# Patient Record
Sex: Female | Born: 1965
Health system: Southern US, Community
[De-identification: ages and names within clinical notes are randomized; demographics above are authoritative.]

## PROBLEM LIST (undated history)

## (undated) DIAGNOSIS — K219 Gastro-esophageal reflux disease without esophagitis: Secondary | ICD-10-CM

## (undated) DIAGNOSIS — E119 Type 2 diabetes mellitus without complications: Secondary | ICD-10-CM

## (undated) DIAGNOSIS — M549 Dorsalgia, unspecified: Secondary | ICD-10-CM

## (undated) DIAGNOSIS — G8929 Other chronic pain: Secondary | ICD-10-CM

## (undated) DIAGNOSIS — M5430 Sciatica, unspecified side: Secondary | ICD-10-CM

## (undated) DIAGNOSIS — J45909 Unspecified asthma, uncomplicated: Secondary | ICD-10-CM

## (undated) HISTORY — DX: Type 2 diabetes mellitus without complications: E11.9

## (undated) HISTORY — PX: VEIN SURGERY: SHX48

---

## 2002-03-14 ENCOUNTER — Ambulatory Visit (HOSPITAL_COMMUNITY): Admission: RE | Admit: 2002-03-14 | Discharge: 2002-03-14 | Payer: Self-pay | Admitting: Internal Medicine

## 2002-03-14 ENCOUNTER — Encounter: Payer: Self-pay | Admitting: Internal Medicine

## 2006-09-24 ENCOUNTER — Ambulatory Visit (HOSPITAL_COMMUNITY): Admission: RE | Admit: 2006-09-24 | Discharge: 2006-09-24 | Payer: Self-pay | Admitting: Family Medicine

## 2007-07-06 ENCOUNTER — Ambulatory Visit: Payer: Self-pay | Admitting: Internal Medicine

## 2007-07-06 DIAGNOSIS — K219 Gastro-esophageal reflux disease without esophagitis: Secondary | ICD-10-CM | POA: Insufficient documentation

## 2007-07-06 DIAGNOSIS — I739 Peripheral vascular disease, unspecified: Secondary | ICD-10-CM | POA: Insufficient documentation

## 2007-07-06 DIAGNOSIS — J45909 Unspecified asthma, uncomplicated: Secondary | ICD-10-CM | POA: Insufficient documentation

## 2007-07-07 ENCOUNTER — Telehealth (INDEPENDENT_AMBULATORY_CARE_PROVIDER_SITE_OTHER): Payer: Self-pay | Admitting: *Deleted

## 2007-07-08 ENCOUNTER — Encounter (INDEPENDENT_AMBULATORY_CARE_PROVIDER_SITE_OTHER): Payer: Self-pay | Admitting: Internal Medicine

## 2007-07-11 ENCOUNTER — Ambulatory Visit (HOSPITAL_COMMUNITY): Admission: RE | Admit: 2007-07-11 | Discharge: 2007-07-11 | Payer: Self-pay | Admitting: Internal Medicine

## 2007-07-13 ENCOUNTER — Telehealth (INDEPENDENT_AMBULATORY_CARE_PROVIDER_SITE_OTHER): Payer: Self-pay | Admitting: Internal Medicine

## 2007-07-14 ENCOUNTER — Encounter (INDEPENDENT_AMBULATORY_CARE_PROVIDER_SITE_OTHER): Payer: Self-pay | Admitting: Internal Medicine

## 2007-07-16 ENCOUNTER — Encounter (INDEPENDENT_AMBULATORY_CARE_PROVIDER_SITE_OTHER): Payer: Self-pay | Admitting: Internal Medicine

## 2007-07-20 ENCOUNTER — Telehealth (INDEPENDENT_AMBULATORY_CARE_PROVIDER_SITE_OTHER): Payer: Self-pay | Admitting: *Deleted

## 2007-07-21 ENCOUNTER — Encounter (INDEPENDENT_AMBULATORY_CARE_PROVIDER_SITE_OTHER): Payer: Self-pay | Admitting: Internal Medicine

## 2007-07-21 ENCOUNTER — Telehealth (INDEPENDENT_AMBULATORY_CARE_PROVIDER_SITE_OTHER): Payer: Self-pay | Admitting: *Deleted

## 2007-07-22 ENCOUNTER — Encounter (INDEPENDENT_AMBULATORY_CARE_PROVIDER_SITE_OTHER): Payer: Self-pay | Admitting: Internal Medicine

## 2007-07-25 ENCOUNTER — Ambulatory Visit (HOSPITAL_COMMUNITY): Admission: RE | Admit: 2007-07-25 | Discharge: 2007-07-25 | Payer: Self-pay | Admitting: Internal Medicine

## 2007-08-29 ENCOUNTER — Ambulatory Visit: Payer: Self-pay | Admitting: Internal Medicine

## 2007-08-29 DIAGNOSIS — D485 Neoplasm of uncertain behavior of skin: Secondary | ICD-10-CM

## 2007-08-31 ENCOUNTER — Telehealth (INDEPENDENT_AMBULATORY_CARE_PROVIDER_SITE_OTHER): Payer: Self-pay | Admitting: Internal Medicine

## 2007-09-02 ENCOUNTER — Telehealth (INDEPENDENT_AMBULATORY_CARE_PROVIDER_SITE_OTHER): Payer: Self-pay | Admitting: *Deleted

## 2007-09-05 ENCOUNTER — Encounter (INDEPENDENT_AMBULATORY_CARE_PROVIDER_SITE_OTHER): Payer: Self-pay | Admitting: Internal Medicine

## 2007-10-12 ENCOUNTER — Telehealth (INDEPENDENT_AMBULATORY_CARE_PROVIDER_SITE_OTHER): Payer: Self-pay | Admitting: Internal Medicine

## 2007-10-12 ENCOUNTER — Telehealth (INDEPENDENT_AMBULATORY_CARE_PROVIDER_SITE_OTHER): Payer: Self-pay | Admitting: *Deleted

## 2007-10-12 DIAGNOSIS — I8390 Asymptomatic varicose veins of unspecified lower extremity: Secondary | ICD-10-CM | POA: Insufficient documentation

## 2007-10-17 ENCOUNTER — Ambulatory Visit: Payer: Self-pay | Admitting: Vascular Surgery

## 2007-11-15 ENCOUNTER — Ambulatory Visit: Payer: Self-pay | Admitting: Vascular Surgery

## 2007-12-26 ENCOUNTER — Ambulatory Visit: Payer: Self-pay | Admitting: Vascular Surgery

## 2008-01-03 ENCOUNTER — Ambulatory Visit: Payer: Self-pay | Admitting: Vascular Surgery

## 2009-01-28 ENCOUNTER — Other Ambulatory Visit: Admission: RE | Admit: 2009-01-28 | Discharge: 2009-01-28 | Payer: Self-pay | Admitting: Obstetrics and Gynecology

## 2009-03-27 ENCOUNTER — Ambulatory Visit: Payer: Self-pay | Admitting: Internal Medicine

## 2009-03-27 DIAGNOSIS — J069 Acute upper respiratory infection, unspecified: Secondary | ICD-10-CM | POA: Insufficient documentation

## 2011-03-10 NOTE — Consult Note (Signed)
VASCULAR SURGERY CONSULTATION   [name], Jessica F  DOB:  October 10, 1966                                       10/17/2007  ZOXWR#:[id number]   This is a vascular surgery consultation.   Jessica [name] is a 45 year old female patient who developed a stasis ulcer  in the right ankle area about 3-4 months ago.  She has treated this with  antibiotic ointment and has tried wearing a short-leg elastic  compression stocking at home, but has had no improvement, and she was  referred for further evaluation.  She has had varicose veins in the  right calf area for many years, and has noticed some darkening of the  skin over the last few years in the right ankle area.  She describes the  pain over these areas as burning, throbbing and aching in the calf, and  also in the area of the ulceration, does have swelling as the day  progresses.  She has no history of deep venous thrombosis,  thrombophlebitis, pulmonary emboli or clotting problems.  She has had no  bleeding from the ulcer.   PAST MEDICAL HISTORY:  Negative for diabetes, hypertension, coronary  artery disease, hyperlipidemia or stroke.   PAST SURGICAL HISTORY:  None.   FAMILY HISTORY:  Negative for coronary artery disease, diabetes and  stroke.   SOCIAL HISTORY:  She is married, has 2 children.  Smokes a half-pack of  cigarettes per day, and does drink occasional alcohol.   ALLERGIES:  None known.   MEDICATIONS:  None.   PHYSICAL EXAMINATION:  Blood pressure 135/77, heart rate 68,  respirations 16.  GENERAL:  She is an obese, middle-aged female, no apparent distress.  Alert and oriented x3.  NECK:  Supple.  3+ carotid pulse is palpable.  No bruits are audible.  CHEST:  Clear to auscultation.  CARDIOVASCULAR EXAM:  Regular rhythm with no murmurs.  ABDOMEN:  Obese.  No palpable masses.  She has 3+ femoral, popliteal and dorsalis pedal pulses palpable  bilaterally.  Right leg has typical varicosities in the right  greater  saphenous distribution in the proximal calf and distal thigh area.  There is hyperpigmentation in the lower third of the right leg with an  active venous stasis ulcer measuring a centimeter in diameter proximal  to the medial malleolus.  There is 1+ edema distally.  Left leg has no  appreciable varicosities and is well perfused.   Venous duplex exam reveals gross incompetence of the right  saphenofemoral junction and right greater saphenous vein with a  competent right short saphenous vein.   She does have severe venous insufficiency with a CEAP-6 venous stasis  ulcer, active in the right lower third of the leg.  She will be fitted  for long-leg elastic compression stockings, and continued conservative  treatment for this ulceration, and I will have her return in 4 weeks for  further examination.  She currently does not have any health insurance,  and is going to look into obtaining Medicaid for this procedure, and  will look into that before returning at our next visit.  She will  require laser ablation of her right greater saphenous system in order to  heal this ulcer on a chronic basis.   Quita Skye Hart [place], M.D.  Electronically Signed  JDL/MEDQ  D:  10/17/2007  T:  10/18/2007  Job:  [id number] 

## 2011-03-10 NOTE — Assessment & Plan Note (Signed)
OFFICE VISIT   Jessica [name], Jessica [name]  DOB:  Oct 17, 1966                                       11/15/2007  ALPFX#:[id number]   The patient returns today and her venous stasis ulcer has significantly  improved since wearing elastic compression stockings.  Today, measures  about 0.7 to 0.8 cm with eschar being less inflamed.  There is some  chronic hyperpigmentation in the skin surrounding this.  She has been  wearing her elastic stocking daily.  Her husband has gotten employment  and will be available for health insurance in 3 months, and she will  return in 3 months to discuss this, as long as this ulcer is improving  symptomatically.  If it starts progressing and becoming worse, she will  be in touch with [place], and we will see her again at that time.   Quita Skye Hart [place], M.D.  Electronically Signed   JDL/MEDQ  D:  11/15/2007  T:  11/16/2007  Job:  [id number]

## 2011-03-10 NOTE — Assessment & Plan Note (Signed)
OFFICE VISIT   [name], Jessica F  DOB:  03-25-1966                                       01/03/2008  ZOXWR#:[id number]   Patient underwent laser ablation of her right greater saphenous vein  with a few stab phlebectomies in the mid calf one week ago for a chronic  venous stasis ulcer, which has been healing with elastic compression.   Her leg looks good today with mild tenderness along the course of the  greater saphenous vein in the thigh.  There is no evidence of any  bruising or skin blistering.  There is no distal edema noted.  The  stasis ulcer continues to heal, being only a few millimeters in size  today and is quite clean and noninflamed.  The stab phlebectomy sites  have healed well.   I performed a venous duplex exam, and there is no evidence of any deep  venous thrombosis or clot, and the greater saphenous vein is totally  occluded from the area of the saphenofemoral junction to the knee.  She  is reassured regarding these findings, advised to wear chronic elastic  compression because of her history of stasis ulcers and return to see [place]  on a p.r.n. basis.   Quita Skye Hart [place], M.D.  Electronically Signed   JDL/MEDQ  D:  01/03/2008  T:  01/04/2008  Job:  [id number]

## 2011-03-10 NOTE — Procedures (Signed)
LOWER EXTREMITY VENOUS REFLUX EXAM   INDICATION:  Right lower extremity venostasis ulcer since this last  summer.  Also complains of pain and swelling to the right lower  extremity.  Denies history of deep or superficial venous thrombosis.   EXAM:  Using color-flow imaging and pulse Doppler spectral analysis, the  right common femoral, superficial femoral, popliteal, posterior tibial,  greater and lesser saphenous veins are evaluated.  There is no evidence  suggesting deep venous insufficiency in the right lower extremity  involving CSV.   The right saphenofemoral junction is not competent.  The right GSV is  not competent with the caliber as described below.   The right proximal short saphenous vein demonstrates competency for GSV  diameters.   GSV Diameter (used if found to be incompetent only)                                            Right        Left  Proximal Greater Saphenous Vein           1.5 cm       cm  Proximal-to-mid-thigh                     0.97 cm      cm  Mid thigh                                 0.83 cm      cm  Mid-distal thigh                          0.95 cm      cm  Distal thigh                              0.92 cm      cm                                 Knee  0.73   Perforators noted in the right calf.  May warrant further interrogation  in the future if clinically indicated and the wound is healed. cm cm   IMPRESSION:  1. Right greater saphenous vein reflux is identified with the caliber      ranging from 0.73 cm to 1.5 cm knee to groin.  2. 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  3. The right greater saphenous vein is not tortuous.  4. The deep venous system is not competent in the right common femoral      vein.  5. The right lesser saphenous vein is competent.   ___________________________________________  Quita Skye. Hart [place], M.D.   AR/MEDQ  D:  10/18/2007  T:  10/19/2007  Job:   [id number]

## 2013-05-07 ENCOUNTER — Emergency Department (HOSPITAL_COMMUNITY)
Admission: EM | Admit: 2013-05-07 | Discharge: 2013-05-07 | Disposition: A | Discharge status: Home / Self Care | Payer: BC Managed Care – PPO | Attending: Emergency Medicine | Admitting: Emergency Medicine

## 2013-05-07 ENCOUNTER — Encounter (HOSPITAL_COMMUNITY): Payer: Self-pay | Admitting: *Deleted

## 2013-05-07 DIAGNOSIS — R5383 Other fatigue: Secondary | ICD-10-CM | POA: Insufficient documentation

## 2013-05-07 DIAGNOSIS — F17200 Nicotine dependence, unspecified, uncomplicated: Secondary | ICD-10-CM | POA: Insufficient documentation

## 2013-05-07 DIAGNOSIS — R5381 Other malaise: Secondary | ICD-10-CM | POA: Insufficient documentation

## 2013-05-07 DIAGNOSIS — Z3202 Encounter for pregnancy test, result negative: Secondary | ICD-10-CM | POA: Insufficient documentation

## 2013-05-07 DIAGNOSIS — R55 Syncope and collapse: Secondary | ICD-10-CM

## 2013-05-07 LAB — URINALYSIS, ROUTINE W REFLEX MICROSCOPIC
Bilirubin Urine: NEGATIVE
Glucose, UA: NEGATIVE mg/dL
Hgb urine dipstick: NEGATIVE
Ketones, ur: NEGATIVE mg/dL
Leukocytes, UA: NEGATIVE
Protein, ur: NEGATIVE mg/dL
pH: 6.5 (ref 5.0–8.0)

## 2013-05-07 LAB — CBC WITH DIFFERENTIAL/PLATELET
Basophils Relative: 0 % (ref 0–1)
Eosinophils Absolute: 0.2 10*3/uL (ref 0.0–0.7)
Eosinophils Relative: 2 % (ref 0–5)
Hemoglobin: 15.6 g/dL — ABNORMAL HIGH (ref 12.0–15.0)
Lymphs Abs: 1.8 10*3/uL (ref 0.7–4.0)
MCH: 31.1 pg (ref 26.0–34.0)
MCHC: 34.2 g/dL (ref 30.0–36.0)
MCV: 91 fL (ref 78.0–100.0)
Monocytes Relative: 8 % (ref 3–12)
Neutrophils Relative %: 76 % (ref 43–77)

## 2013-05-07 LAB — BASIC METABOLIC PANEL
BUN: 8 mg/dL (ref 6–23)
CO2: 28 mEq/L (ref 19–32)
Calcium: 9.1 mg/dL (ref 8.4–10.5)
GFR calc non Af Amer: >90 mL/min (ref >90)
Glucose, Bld: 141 mg/dL — ABNORMAL HIGH (ref 70–99)

## 2013-05-07 NOTE — ED Notes (Addendum)
Pt c/o sudden onset of dizziness associated with nausea. Pt states that she is "ok" just sitting in chair but when she tries to get up or turns her head she becomes "swimmy headed". Denies any pain, LOC, weakness  Speech clear, face is symmetrical. Pt did take her blood pressure at home with reading of 174/96

## 2013-05-07 NOTE — ED Notes (Signed)
Pt presents with dizziness, lightheaded, nauseated and weakness this morning. Pt states was sitting at computer when episode occurred. Assisted to sofa where pt states felt better and got in shower.  Pt states feels better at this time. Pt denies diabetes, fever,and SOB/ Chest pain. NAD noted at this time. No deficits noted at this time.

## 2013-05-07 NOTE — ED Provider Notes (Signed)
History    CSN: [id number] Arrival date & time 05/07/13  1251  First MD Initiated Contact with Patient 05/07/13 1313     Chief Complaint  Patient presents with  . Dizziness    Patient is a 47 y.o. female presenting with weakness. The history is provided by the patient.  Weakness This is a new problem. The current episode started 1 to 2 hours ago. The problem occurs constantly. The problem has been gradually improving. Pertinent negatives include no chest pain, no abdominal pain, no headaches and no shortness of breath. Nothing aggravates the symptoms. Nothing relieves the symptoms. She has tried rest for the symptoms. The treatment provided moderate relief.  pt reports she was looking at computer earlier today when she felt as though she might pass out.  She felt lightheaded and diffuse weakness No HA No visual changes No CP/SOB No focal weakness No syncope Denies vertigo She has never had this before She can ambulate No meds taken today She has no medical problems No h/o syncope previously  No h/o CAD/CVA  PMH - none  Past Surgical History  Procedure Laterality Date  . Cesarean section    . Vein surgery     No family history on file. History  Substance Use Topics  . Smoking status: Current Every Day Smoker  . Smokeless tobacco: Not on file  . Alcohol Use: Yes     Comment: occassional   OB History   Grav Para Term Preterm Abortions TAB SAB Ect Mult Living                 Review of Systems  Constitutional: Negative for fever.  Eyes: Negative for visual disturbance.  Respiratory: Negative for shortness of breath.   Cardiovascular: Negative for chest pain.  Gastrointestinal: Negative for vomiting, abdominal pain, diarrhea and blood in stool.  Genitourinary: Negative for dysuria and vaginal bleeding.  Musculoskeletal: Negative for back pain.  Neurological: Positive for dizziness and weakness. Negative for syncope, speech difficulty and headaches.   Psychiatric/Behavioral: Negative for agitation.  All other systems reviewed and are negative.    Allergies  Hydrocodone  Home Medications  No current outpatient prescriptions on file. BP 174/100  Pulse 78  Temp(Src) 98.5 F (36.9 C) (Oral)  Resp 18  Ht 5\' 6"  (1.676 m)  Wt 283 lb (128.368 kg)  BMI 45.7 kg/m2  SpO2 96%  LMP 04/07/2013 Physical Exam CONSTITUTIONAL: Well developed/well nourished HEAD: Normocephalic/atraumatic EYES: EOMI/PERRL, no nystagmus ENMT: Mucous membranes moist, uvula midline, pharynx nonerythematous NECK: supple no meningeal signs, no bruits SPINE:entire spine nontender CV: S1/S2 noted, no murmurs/rubs/gallops noted LUNGS: Lungs are clear to auscultation bilaterally, no apparent distress ABDOMEN: soft, nontender, no rebound or guarding GU:no cva tenderness NEURO:Awake/alert, facies symmetric, no arm or leg drift is noted Cranial nerves 3/4/5/6/05/03/09/11/12 tested and intact Gait normal without ataxia No past pointing EXTREMITIES: pulses normal, full ROM SKIN: warm, color normal PSYCH: no abnormalities of mood noted   ED Course  Procedures Labs Reviewed  BASIC METABOLIC PANEL  CBC WITH DIFFERENTIAL  TROPONIN I  URINALYSIS, ROUTINE W REFLEX MICROSCOPIC   Pt stable in the ED She had elevation in HR after standing but no hypotension She is in no distress, resting comfortably On reassessment, suspicion for ACS is low.  She had no cp/sob.   She denied any symptoms upon movement of her head here in the ED   I doubt significant cardiac dysrhythmia/ACS/PE No signs of acute CVA or occult posterior stroke No recent  trauma to suggest vascular injury to neck I advised her to quit smoking  We discussed strict return precautions She was referred to cardiology and may need to wear holter monitor  MDM  Nursing notes including past medical history and social history reviewed and considered in documentation Labs/vital reviewed and considered     Date: 05/07/2013 1404  Rate: 65  Rhythm: normal sinus rhythm  QRS Axis: normal  Intervals: normal  ST/T Wave abnormalities: normal  Conduction Disutrbances:none      Joya Gaskins, MD 05/07/13 1537

## 2013-10-28 ENCOUNTER — Encounter (HOSPITAL_COMMUNITY): Payer: Self-pay | Admitting: Emergency Medicine

## 2013-10-28 ENCOUNTER — Emergency Department (HOSPITAL_COMMUNITY)
Admission: EM | Admit: 2013-10-28 | Discharge: 2013-10-28 | Disposition: A | Discharge status: Home / Self Care | Payer: BC Managed Care – PPO | Attending: Emergency Medicine | Admitting: Emergency Medicine

## 2013-10-28 DIAGNOSIS — F17200 Nicotine dependence, unspecified, uncomplicated: Secondary | ICD-10-CM | POA: Insufficient documentation

## 2013-10-28 DIAGNOSIS — M5431 Sciatica, right side: Secondary | ICD-10-CM

## 2013-10-28 DIAGNOSIS — R05 Cough: Secondary | ICD-10-CM | POA: Insufficient documentation

## 2013-10-28 DIAGNOSIS — J3489 Other specified disorders of nose and nasal sinuses: Secondary | ICD-10-CM | POA: Insufficient documentation

## 2013-10-28 DIAGNOSIS — M5430 Sciatica, unspecified side: Secondary | ICD-10-CM | POA: Insufficient documentation

## 2013-10-28 DIAGNOSIS — R059 Cough, unspecified: Secondary | ICD-10-CM | POA: Insufficient documentation

## 2013-10-28 NOTE — ED Notes (Signed)
Pt able to ambulate to bathroom without difficulty. Gait WNL.

## 2013-10-28 NOTE — Discharge Instructions (Signed)
Your examination and history are consistent with possible sciatica. Please alternate heat and ice to your lower back. Please rest your back with legs elevated when possible. Please use Aleve daily. Please use Tylenol in between the doses if needed for pain. Please see your primary physician as sone as possible for additional workup and possible referral to specialist. Sciatica Sciatica is pain, weakness, numbness, or tingling along your sciatic nerve. The nerve starts in the lower back and runs down the back of each leg. Nerve damage or certain conditions pinch or put pressure on the sciatic nerve. This causes the pain, weakness, and other discomforts of sciatica. HOME CARE   Only take medicine as told by your doctor.  Apply ice to the affected area for 20 minutes. Do this 3 4 times a day for the first 48 72 hours. Then try heat in the same way.  Exercise, stretch, or do your usual activities if these do not make your pain worse.  Go to physical therapy as told by your doctor.  Keep all doctor visits as told.  Do not wear high heels or shoes that are not supportive.  Get a firm mattress if your mattress is too soft to lessen pain and discomfort. GET HELP RIGHT AWAY IF:   You cannot control when you poop (bowel movement) or pee (urinate).  You have more weakness in your lower back, lower belly (pelvis), butt (buttocks), or legs.  You have redness or puffiness (swelling) of your back.  You have a burning feeling when you pee.  You have pain that gets worse when you lie down.  You have pain that wakes you from your sleep.  Your pain is worse than past pain.  Your pain lasts longer than 4 weeks.  You are suddenly losing weight without reason. MAKE SURE YOU:   Understand these instructions.  Will watch this condition.  Will get help right away if you are not doing well or get worse. Document Released: 07/21/2008 Document Revised: 04/12/2012 Document Reviewed:  02/21/2012 [facility] Patient Information 2014 [place].

## 2013-10-28 NOTE — ED Provider Notes (Signed)
CSN: [id number]     Arrival date & time 10/28/13  1650 History   First MD Initiated Contact with Patient 10/28/13 1746     Chief Complaint  Patient presents with  . Numbness   (Consider location/radiation/quality/duration/timing/severity/associated sxs/prior Treatment) HPI Comments: Patient is a 48 year old female who presents to the emergency department with pain and at times numbness to the right lower back extending to the right leg. The patient denies any recent falls. She's not had any loss of bowel or bladder function. The patient states she's been having problems with her back for" months". She started having problem on last evening with pain and numbness going from the back down into the lower leg. She states she's been under a great deal of stress because of adult children. She was concerned she may be having a stroke, and presented to the emergency department for additional evaluation and management. There's been no changes in her speech, no changes in her thought pattern per the family member, and no new changes in her ability to walk.  The history is provided by the patient.    History reviewed. No pertinent past medical history. Past Surgical History  Procedure Laterality Date  . Cesarean section    . Vein surgery     No family history on file. History  Substance Use Topics  . Smoking status: Current Every Day Smoker  . Smokeless tobacco: Not on file  . Alcohol Use: Yes     Comment: occassional   OB History   Grav Para Term Preterm Abortions TAB SAB Ect Mult Living                 Review of Systems  Constitutional: Negative for activity change.       All ROS Neg except as noted in HPI  HENT: Negative for nosebleeds.   Eyes: Negative for photophobia and discharge.  Respiratory: Positive for cough. Negative for shortness of breath and wheezing.   Cardiovascular: Negative for chest pain and palpitations.  Gastrointestinal: Negative for abdominal pain and blood in  stool.  Genitourinary: Negative for dysuria, frequency and hematuria.  Musculoskeletal: Positive for back pain. Negative for arthralgias and neck pain.  Skin: Negative.   Neurological: Negative for dizziness, seizures and speech difficulty.  Psychiatric/Behavioral: Negative for hallucinations and confusion.    Allergies  Hydrocodone  Home Medications   Current Outpatient Rx  Name  Route  Sig  Dispense  Refill  . aspirin 325 MG tablet   Oral   Take 325 mg by mouth daily as needed for pain.          BP 154/73  Pulse 92  Temp(Src) 98.8 F (37.1 C) (Oral)  Resp 20  Ht 5\' 6"  (1.676 m)  Wt 280 lb (127.007 kg)  BMI 45.21 kg/m2  SpO2 98%  LMP 10/18/2013 Physical Exam  Nursing note and vitals reviewed. Constitutional: She is oriented to person, place, and time. She appears well-developed and well-nourished.  Non-toxic appearance.  HENT:  Head: Normocephalic.  Right Ear: Tympanic membrane and external ear normal.  Left Ear: Tympanic membrane and external ear normal.  Nasal congestion present.  Eyes: EOM and lids are normal. Pupils are equal, round, and reactive to light.  Neck: Normal range of motion. Neck supple. Carotid bruit is not present.  Cardiovascular: Normal rate, regular rhythm, normal heart sounds, intact distal pulses and normal pulses.   Pulmonary/Chest: Breath sounds normal. No respiratory distress.  Few scattered rhonchi present. Most clear with cough.  Abdominal: Soft. Bowel sounds are normal. There is no tenderness. There is no guarding.  Musculoskeletal: Normal range of motion.  Right paraspinal area tenderness. Pain with leg raise at 40. No hot areas appreciated.  Lymphadenopathy:       Head (right side): No submandibular adenopathy present.       Head (left side): No submandibular adenopathy present.    She has no cervical adenopathy.  Neurological: She is alert and oriented to person, place, and time. She has normal strength. No cranial nerve deficit or  sensory deficit. She exhibits normal muscle tone. Coordination normal.  No foot drop, no gross neurologic deficits appreciated.  Skin: Skin is warm and dry.  Psychiatric: She has a normal mood and affect. Her speech is normal.    ED Course  Procedures (including critical care time) Labs Review Labs Reviewed - No data to display Imaging Review No results found.  EKG Interpretation   None       MDM   1. Sciatica of right side    **I have reviewed nursing notes, vital signs, and all appropriate lab and imaging results for this patient.  Patient presents to the emergency department for evaluation of pain, and numbness from the right lower back down the right leg. There was no loss of bowel or bladder function. No recent falls. No foot drop or dragging of an extremity. The patient states she's been having back problems for" months", but this problem began on last evening and continued into the the day today.   No acute neurologic deficits appreciated. The vital signs are within normal limits. The blood pressure is slightly elevated at 154/73. Pulse oximetry is 98% on room.  I offered the patient muscle relaxers, nonsteroidal anti-inflammatory medication, and steroids. The patient declined these medications, states she will use Aleve and maybe Tylenol. The patient also states that she may try alternating heat and ice. Patient advised to see her primary physician for additional management including orthopedic referral and or MRI testing.  Lenox Ahr, PA-C 10/28/13 1849

## 2013-10-28 NOTE — ED Notes (Signed)
Pt states pain is with movement

## 2013-10-28 NOTE — ED Notes (Signed)
Complain of pain in right lower back and pain in right lower leg

## 2013-10-29 NOTE — ED Provider Notes (Signed)
Medical screening examination/treatment/procedure(s) were performed by non-physician practitioner and as supervising physician I was immediately available for consultation/collaboration.  EKG Interpretation   None       Rolland Porter, MD, Abram Sander   Janice Norrie, MD 10/29/13 Lupita Shutter

## 2013-11-01 ENCOUNTER — Encounter (HOSPITAL_COMMUNITY): Payer: Self-pay | Admitting: Emergency Medicine

## 2013-11-01 ENCOUNTER — Emergency Department (HOSPITAL_COMMUNITY)
Admission: EM | Admit: 2013-11-01 | Discharge: 2013-11-01 | Disposition: A | Discharge status: Home / Self Care | Payer: BC Managed Care – PPO | Attending: Emergency Medicine | Admitting: Emergency Medicine

## 2013-11-01 DIAGNOSIS — F17200 Nicotine dependence, unspecified, uncomplicated: Secondary | ICD-10-CM | POA: Insufficient documentation

## 2013-11-01 DIAGNOSIS — J329 Chronic sinusitis, unspecified: Secondary | ICD-10-CM | POA: Insufficient documentation

## 2013-11-01 DIAGNOSIS — M5412 Radiculopathy, cervical region: Secondary | ICD-10-CM | POA: Insufficient documentation

## 2013-11-01 HISTORY — DX: Sciatica, unspecified side: M54.30

## 2013-11-01 LAB — COMPREHENSIVE METABOLIC PANEL
ALT: 16 U/L (ref 0–35)
AST: 19 U/L (ref 0–37)
Albumin: 3.6 g/dL (ref 3.5–5.2)
Alkaline Phosphatase: 80 U/L (ref 39–117)
BILIRUBIN TOTAL: 0.4 mg/dL (ref 0.3–1.2)
BUN: 12 mg/dL (ref 6–23)
CALCIUM: 9.2 mg/dL (ref 8.4–10.5)
CHLORIDE: 101 meq/L (ref 96–112)
CO2: 29 meq/L (ref 19–32)
Creatinine, Ser: 0.59 mg/dL (ref 0.50–1.10)
GFR calc Af Amer: >90 mL/min (ref >90)
Glucose, Bld: 95 mg/dL (ref 70–99)
Potassium: 4.6 mEq/L (ref 3.7–5.3)
SODIUM: 139 meq/L (ref 137–147)
Total Protein: 8 g/dL (ref 6.0–8.3)

## 2013-11-01 LAB — CBC WITH DIFFERENTIAL/PLATELET
BASOS ABS: 0 10*3/uL (ref 0.0–0.1)
Basophils Relative: 0 % (ref 0–1)
Eosinophils Absolute: 0.1 10*3/uL (ref 0.0–0.7)
Eosinophils Relative: 1 % (ref 0–5)
HCT: 49.3 % — ABNORMAL HIGH (ref 36.0–46.0)
Hemoglobin: 16.6 g/dL — ABNORMAL HIGH (ref 12.0–15.0)
LYMPHS PCT: 16 % (ref 12–46)
Lymphs Abs: 1.8 10*3/uL (ref 0.7–4.0)
MCH: 31 pg (ref 26.0–34.0)
MCHC: 33.7 g/dL (ref 30.0–36.0)
MCV: 92 fL (ref 78.0–100.0)
Monocytes Absolute: 1 10*3/uL (ref 0.1–1.0)
Monocytes Relative: 9 % (ref 3–12)
NEUTROS ABS: 8.4 10*3/uL — AB (ref 1.7–7.7)
Neutrophils Relative %: 74 % (ref 43–77)
PLATELETS: 310 10*3/uL (ref 150–400)
RBC: 5.36 MIL/uL — ABNORMAL HIGH (ref 3.87–5.11)
RDW: 13.6 % (ref 11.5–15.5)
WBC: 11.3 10*3/uL — AB (ref 4.0–10.5)

## 2013-11-01 MED ORDER — AMOXICILLIN 500 MG PO CAPS: 500.0000 mg | ORAL_CAPSULE | Freq: Three times a day (TID) | ORAL | Status: DC

## 2013-11-01 NOTE — ED Notes (Signed)
Pt became dizzy when standing for ekg.  Vs obtained, 158/81, pulse 61

## 2013-11-01 NOTE — Discharge Instructions (Signed)
Follow up if not improving

## 2013-11-01 NOTE — ED Notes (Signed)
Pt alert & oriented x4, stable gait. Patient given discharge instructions, paperwork & prescription(s). Patient  instructed to stop at the registration desk to finish any additional paperwork. Patient verbalized understanding. Pt left department w/ no further questions. 

## 2013-11-01 NOTE — ED Notes (Signed)
Pt reports waking this am with dizziness, that has now resolved.

## 2013-11-01 NOTE — ED Provider Notes (Signed)
CSN: [id number]     Arrival date & time 11/01/13  0802 History   First MD Initiated Contact with Patient 11/01/13 0949 This chart was scribed for Maudry Diego, MD by Anastasia Pall, ED Scribe. This patient was seen in room APA03/APA03 and the patient's care was started at 9:59 AM.     Chief Complaint  Patient presents with  . Dizziness    Patient is a 48 y.o. female presenting with dizziness. The history is provided by the patient. No language interpreter was used.  Dizziness Quality:  Lightheadedness Duration: Earlier this morning after waking up. Timing: one episode. Progression:  Resolved Chronicity:  New Associated symptoms: no syncope   Associated symptoms comment:  Positive for cough, congestion.   HPI Comments: Jessica [name] is a 48 y.o. female who presents to the Emergency Department complaining of one episode of light-headedness, stating it feels like she is going to pass out, onset earlier this morning after waking up. She reports the incident has resolved. She reports h/o similar episode of light-headedness. She reports having cold symptoms for 2 weeks, but denies being on any medication for sinus infection. She reports being here recently and diagnosed with sciatica. She reports lower back pain and right leg numbness. She denies LOC, abdominal pain, any other associated symptoms.   PCP - No PCP Per Patient  Past Medical History  Diagnosis Date  . Sciatica    Past Surgical History  Procedure Laterality Date  . Cesarean section    . Vein surgery     No family history on file. History  Substance Use Topics  . Smoking status: Current Every Day Smoker    Types: Cigarettes  . Smokeless tobacco: Not on file  . Alcohol Use: Yes     Comment: occassional   OB History   Grav Para Term Preterm Abortions TAB SAB Ect Mult Living                 Review of Systems  Constitutional: Negative for appetite change and fatigue.  HENT: Positive for congestion. Negative for ear  discharge and sinus pressure.   Eyes: Negative for discharge.  Respiratory: Positive for cough.   Cardiovascular: Negative for syncope.  Gastrointestinal: Negative for abdominal pain.  Genitourinary: Negative for frequency and hematuria.  Musculoskeletal: Positive for back pain (lower).  Skin: Negative for rash.  Neurological: Positive for light-headedness and numbness (right leg). Negative for seizures and syncope.  Psychiatric/Behavioral: Negative for hallucinations.    Allergies  Hydrocodone  Home Medications   Current Outpatient Rx  Name  Route  Sig  Dispense  Refill  . naproxen sodium (ALEVE) 220 MG tablet   Oral   Take 220 mg by mouth 2 (two) times daily as needed.         Marland Kitchen aspirin 325 MG tablet   Oral   Take 325 mg by mouth daily as needed for pain.          BP 158/81  Pulse 61  Temp(Src) 98.3 F (36.8 C) (Oral)  Resp 20  Ht 5\' 6"  (1.676 m)  Wt 280 lb (127.007 kg)  BMI 45.21 kg/m2  SpO2 99%  LMP 10/18/2013  Physical Exam  Constitutional: She is oriented to person, place, and time. She appears well-developed.  HENT:  Head: Normocephalic.  Eyes: Conjunctivae and EOM are normal. No scleral icterus.  Neck: Neck supple. No thyromegaly present.  Cardiovascular: Normal rate and regular rhythm.  Exam reveals no gallop and no friction rub.  No murmur heard. Pulmonary/Chest: No stridor. She has no wheezes. She has no rales. She exhibits no tenderness.  Abdominal: She exhibits no distension. There is no tenderness. There is no rebound.  Musculoskeletal: Normal range of motion. She exhibits no edema.  Lymphadenopathy:    She has no cervical adenopathy.  Neurological: She is oriented to person, place, and time. She exhibits normal muscle tone. Coordination normal.  Skin: No rash noted. No erythema.  Psychiatric: She has a normal mood and affect. Her behavior is normal.    ED Course  Procedures (including critical care time)  DIAGNOSTIC STUDIES: Oxygen  Saturation is 99% on room air, normal by my interpretation.    COORDINATION OF CARE: 10:03 AM-Discussed treatment plan which includes CBC, CMP, EKG, Orthostatic vital with pt at bedside and pt agreed to plan.    Labs Review Labs Reviewed  CBC WITH DIFFERENTIAL - Abnormal; Notable for the following:    WBC 11.3 (*)    RBC 5.36 (*)    Hemoglobin 16.6 (*)    HCT 49.3 (*)    Neutro Abs 8.4 (*)    All other components within normal limits  COMPREHENSIVE METABOLIC PANEL   Imaging Review No results found.  EKG Interpretation   None       MDM  sinusitis  Maudry Diego, MD 11/01/13 1343

## 2013-11-07 ENCOUNTER — Emergency Department (HOSPITAL_COMMUNITY)
Admission: EM | Admit: 2013-11-07 | Discharge: 2013-11-08 | Disposition: A | Discharge status: Home / Self Care | Payer: BC Managed Care – PPO | Attending: Emergency Medicine | Admitting: Emergency Medicine

## 2013-11-07 ENCOUNTER — Encounter (HOSPITAL_COMMUNITY): Payer: Self-pay | Admitting: Emergency Medicine

## 2013-11-07 DIAGNOSIS — F17200 Nicotine dependence, unspecified, uncomplicated: Secondary | ICD-10-CM | POA: Insufficient documentation

## 2013-11-07 DIAGNOSIS — Z792 Long term (current) use of antibiotics: Secondary | ICD-10-CM | POA: Insufficient documentation

## 2013-11-07 DIAGNOSIS — M545 Low back pain, unspecified: Secondary | ICD-10-CM

## 2013-11-07 DIAGNOSIS — Z7982 Long term (current) use of aspirin: Secondary | ICD-10-CM | POA: Insufficient documentation

## 2013-11-07 DIAGNOSIS — J45909 Unspecified asthma, uncomplicated: Secondary | ICD-10-CM | POA: Insufficient documentation

## 2013-11-07 DIAGNOSIS — G8929 Other chronic pain: Secondary | ICD-10-CM | POA: Insufficient documentation

## 2013-11-07 DIAGNOSIS — K219 Gastro-esophageal reflux disease without esophagitis: Secondary | ICD-10-CM | POA: Insufficient documentation

## 2013-11-07 HISTORY — DX: Gastro-esophageal reflux disease without esophagitis: K21.9

## 2013-11-07 HISTORY — DX: Dorsalgia, unspecified: M54.9

## 2013-11-07 HISTORY — DX: Other chronic pain: G89.29

## 2013-11-07 HISTORY — DX: Unspecified asthma, uncomplicated: J45.909

## 2013-11-07 MED ORDER — HYDROMORPHONE HCL PF 1 MG/ML IJ SOLN
1.0000 mg | Freq: Once | INTRAMUSCULAR | Status: AC
  Administered 2013-11-07: 1 mg via INTRAMUSCULAR
  Filled 2013-11-07: qty 1

## 2013-11-07 MED ORDER — DIAZEPAM 5 MG PO TABS
5.0000 mg | ORAL_TABLET | Freq: Once | ORAL | Status: AC
  Administered 2013-11-07: 5 mg via ORAL
  Filled 2013-11-07: qty 1

## 2013-11-07 NOTE — ED Notes (Signed)
Tearful, severe pain in back and right leg, unable to sit, turn or move without severe pain.  Patient states she has appt in two days at [facility], but cannot wait that long to get some relief.  Seen here on 10/28/13 and states she declined RX for steroids and muscle relaxers, did not think the pain was that bad.  Now pain is severe

## 2013-11-07 NOTE — ED Notes (Signed)
Turned with assistance of spouse.  Pillow back under right hip for comfort

## 2013-11-07 NOTE — ED Notes (Signed)
Diagnosed with sciatica 1 week ago. Used ice to my back yesterday and it is worse. I can't sit or stand with having pain.

## 2013-11-08 MED ORDER — METHYLPREDNISOLONE (PAK) 4 MG PO TABS: ORAL_TABLET | ORAL | Status: DC

## 2013-11-08 MED ORDER — PREDNISONE 50 MG PO TABS
60.0000 mg | ORAL_TABLET | Freq: Once | ORAL | Status: AC
  Administered 2013-11-08: 60 mg via ORAL
  Filled 2013-11-08 (×2): qty 1

## 2013-11-08 MED ORDER — METHOCARBAMOL 500 MG PO TABS: 1000.0000 mg | ORAL_TABLET | Freq: Four times a day (QID) | ORAL | Status: DC | PRN

## 2013-11-08 MED ORDER — OXYCODONE-ACETAMINOPHEN 5-325 MG PO TABS: ORAL_TABLET | ORAL | Status: DC

## 2013-11-08 MED ORDER — OXYCODONE-ACETAMINOPHEN 5-325 MG PO TABS
2.0000 | ORAL_TABLET | Freq: Once | ORAL | Status: AC
  Administered 2013-11-08: 2 via ORAL
  Filled 2013-11-08: qty 2

## 2013-11-08 NOTE — ED Notes (Signed)
Assisted to standing position- screams out with pain.  States pain is severe now (8 or higher)  Assisted to ambulate to her vehicle.

## 2013-11-08 NOTE — ED Provider Notes (Signed)
CSN: [id number]     Arrival date & time 11/07/13  2141 History   First MD Initiated Contact with Patient 11/07/13 2301     Chief Complaint  Patient presents with  . Back Pain    HPI Pt was seen at 2310. Per pt, c/o gradual onset and persistence of constant acute flair of her chronic low back "pain" for the past 1.5 weeks, worse over the past several days. States the pain radiates down her right leg. Denies any change in her usual chronic pain pattern.  Pain worsens with palpation of the area and body position changes. Denies incont/retention of bowel or bladder, no saddle anesthesia, no focal motor weakness, no tingling/numbness in extremities, no fevers, no injury, no abd pain.   The symptoms have been associated with no other complaints. The patient has a significant history of similar symptoms previously, recently being evaluated for this complaint 1 week ago and refused pain meds, muscle relaxants and prednisone.  Pt states she returned to the ED today "because I think I need those prescriptions now." States she has scheduled a PMD appointment in 2 days "but can't wait until then."    Past Medical History  Diagnosis Date  . Sciatica   . Chronic back pain   . Asthma   . GERD (gastroesophageal reflux disease)    Past Surgical History  Procedure Laterality Date  . Cesarean section    . Vein surgery      History  Substance Use Topics  . Smoking status: Current Every Day Smoker    Types: Cigarettes  . Smokeless tobacco: Not on file  . Alcohol Use: Yes     Comment: occassional    Review of Systems ROS: Statement: All systems negative except as marked or noted in the HPI; Constitutional: Negative for fever and chills. ; ; Eyes: Negative for eye pain, redness and discharge. ; ; ENMT: Negative for ear pain, hoarseness, nasal congestion, sinus pressure and sore throat. ; ; Cardiovascular: Negative for chest pain, palpitations, diaphoresis, dyspnea and peripheral edema. ; ; Respiratory:  Negative for cough, wheezing and stridor. ; ; Gastrointestinal: Negative for nausea, vomiting, diarrhea, abdominal pain, blood in stool, hematemesis, jaundice and rectal bleeding. . ; ; Genitourinary: Negative for dysuria, flank pain and hematuria. ; ; Musculoskeletal: +LBP. Negative for neck pain. Negative for swelling and trauma.; ; Skin: Negative for pruritus, rash, abrasions, blisters, bruising and skin lesion.; ; Neuro: Negative for headache, lightheadedness and neck stiffness. Negative for weakness, altered level of consciousness , altered mental status, extremity weakness, paresthesias, involuntary movement, seizure and syncope.      Allergies  Hydrocodone  Home Medications   Current Outpatient Rx  Name  Route  Sig  Dispense  Refill  . amoxicillin (AMOXIL) 500 MG capsule   Oral   Take 1 capsule (500 mg total) by mouth 3 (three) times daily.   42 capsule   0   . aspirin 325 MG tablet   Oral   Take 325 mg by mouth daily as needed for pain.         Neta Ehlers SALINE NASAL SPRAY NA   Nasal   Place 1 spray into the nose as needed (for congestion).         . naproxen sodium (ALEVE) 220 MG tablet   Oral   Take 220 mg by mouth 2 (two) times daily as needed.          BP 130/95  Pulse 84  Temp(Src) 97.9 F (  36.6 C) (Oral)  Resp 20  Ht 5\' 6"  (1.676 m)  Wt 280 lb (127.007 kg)  BMI 45.21 kg/m2  SpO2 96%  LMP 10/18/2013 Physical Exam 2315: Physical examination:  Nursing notes reviewed; Vital signs and O2 SAT reviewed;  Constitutional: Well developed, Well nourished, Well hydrated, Uncomfortable appearing.; Head:  Normocephalic, atraumatic; Eyes: EOMI, PERRL, No scleral icterus; ENMT: Mouth and pharynx normal, Mucous membranes moist; Neck: Supple, Full range of motion, No lymphadenopathy; Cardiovascular: Regular rate and rhythm, No murmur, rub, or gallop; Respiratory: Breath sounds clear & equal bilaterally, No wheezes.  Speaking full sentences with ease, Normal respiratory  effort/excursion; Chest: Nontender, Movement normal; Abdomen: Soft, Nontender, Nondistended, Normal bowel sounds; Genitourinary: No CVA tenderness; Spine:  No midline CS, TS, LS tenderness. +TTP right lumbar paraspinal muscles;; Extremities: Pulses normal, No tenderness, No edema, No calf edema or asymmetry.; Neuro: AA&Ox3, Major CN grossly intact.  Speech clear. No gross focal motor or sensory deficits in extremities. Strength 5/5 equal bilat UE's and LE's, including great toe dorsiflexion.  DTR 2/4 equal bilat UE's and LE's.  No gross sensory deficits.  +straight leg raise right.;; Skin: Color normal, Warm, Dry.   ED Course  Procedures    EKG Interpretation   None       MDM  MDM Reviewed: previous chart, nursing note and vitals     0145:  Pt states the pain "is better" after meds and wants to go home now. Neuro exam remains unchanged. Pt ambulatory with steady upright gait. No ataxia. Dx and testing d/w pt and family.  Questions answered.  Verb understanding, agreeable to d/c home with outpt f/u.       Alfonzo Feller, DO 11/10/13 2348

## 2013-11-08 NOTE — ED Notes (Signed)
Drowsy and still has some pain but states much more tolerable.  Patient is ready to go home now

## 2013-11-08 NOTE — Discharge Instructions (Signed)
°Emergency Department Resource Guide °1) Find a Doctor and Pay Out of Pocket °Although you won't have to find out who is covered by your insurance plan, it is a good idea to ask around and get recommendations. You will then need to call the office and see if the doctor you have chosen will accept you as a new patient and what types of options they offer for patients who are self-pay. Some doctors offer discounts or will set up payment plans for their patients who do not have insurance, but you will need to ask so you aren't surprised when you get to your appointment. ° °2) Contact Your Local Health Department °Not all health departments have doctors that can see patients for sick visits, but many do, so it is worth a call to see if yours does. If you don't know where your local health department is, you can check in your phone book. The CDC also has a tool to help you locate your state's health department, and many state websites also have listings of all of their local health departments. ° °3) Find a Walk-in Clinic °If your illness is not likely to be very severe or complicated, you may want to try a walk in clinic. These are popping up all over the country in pharmacies, drugstores, and shopping centers. They're usually staffed by nurse practitioners or physician assistants that have been trained to treat common illnesses and complaints. They're usually fairly quick and inexpensive. However, if you have serious medical issues or chronic medical problems, these are probably not your best option. ° °No Primary Care Doctor: °- Call [facility] at  832-8000 - they can help you locate a primary care doctor that  accepts your insurance, provides certain services, etc. °- Physician Referral Service- 1-800-533-3463 ° °Chronic Pain Problems: °Organization         Address  Phone   Notes  °[facility]  (336) 297-2271 Patients need to be referred by their primary care doctor.  ° °Medication  Assistance: °Organization         Address  Phone   Notes  °[place] [facility] [address] °[place], [place] [address] (336) 641-8030 --Must be a resident of [place] °-- Must have NO insurance coverage whatsoever (no Medicaid/ Medicare, etc.) °-- The pt. MUST have a primary care doctor that directs their care regularly and follows them in the community °  °[facility]  (866) 331-1348   °[employer]  (888) 892-1162   ° °Agencies that provide inexpensive medical care: °Organization         Address  Phone   Notes  °[facility]  (336) 832-8035   °[facility]    (336) 832-7272   °[facility] [address] °[place], [place] [address] (336) 832-4777   °[facility] [address], ° (336) 271-4999   °[facility]    (336) 373-0678   °[facility]    (336) 272-1050   °[facility] ° [address],  Phone:  (336) 832-4444, Fax:  (336) 832-4440 Hours of Operation:  9 am - 6 pm, M-F.  Also accepts Medicaid/Medicare and self-pay.  °[facility] ° [address],  Phone: (336) 832-3150, Fax: (336) 832-3151. Hours of Operation:  8:30 am - 5:30 pm, M-F.  Also accepts Medicaid and self-pay.  °[facility] [place] [address], [place] Phone: (336) 878-6027   °[facility] [address], [place], [place] (336)723-1848, Ext. 123 Mondays & Thursdays: 7-9 AM.  First 15 patients are seen on a first come, first serve basis. °  ° °Medicaid-accepting [place] Providers: ° °Organization         Address  Phone   Notes  °[facility] [address], [place] (336) 641-2100 Also accepts self-pay patients.  °[facility] [address], [place] ° (336) 856-9996   °[facility] [address], [place]  (336) 288-8857   °[facility] [address] [place] [address], [place] (336) 299-7000   °Veita Bland [address], [place]  ° (336) 373-1557 Only accepts [place] Access Medicaid patients after they have their name applied to their card.  ° °Self-Pay (no insurance) in [place]: ° °Organization         Address  Phone   Notes  °Sickle Cell Patients, [facility] [address], [place] (336) 832-1970   °[facility] [address], [place] (336) 832-4400   °[facility] ° [address], [place] (336) 992-4800   °[facility]/Dr. Osei-Bonsu ° [address] [place] [address], [place] or [address], [place] (336) 841-8500 Phone number for both [place] and [place] locations is the same.  °[facility] [address], [place] (336) 299-0000   °[facility] [address] [place] [address], [place] or [address] (336) 852-7530 °(336) 878-2260   °[facility] [address], [place] (336) 350-1642, phone; (336) 294-5005, fax Sees patients 1st and 3rd Saturday of every month.  Must not qualify for public or private insurance (i.e. Medicaid, Medicare, [place] Health Choice, Veterans' Benefits) • Household income should be no more than 200% of the poverty level •The clinic cannot treat you if you are pregnant or think you are pregnant • Sexually transmitted diseases are not treated at the clinic.  ° ° °Dental Care: °Organization         Address  Phone  Notes  °[place] [facility] [address], [place] (336) 641-6152 Accepts children up to age 21 who are enrolled in Medicaid or [place] Health Choice; pregnant women with a Medicaid card; and children who have applied for Medicaid or [place] Health Choice, but were declined, whose parents can pay a reduced fee at time of service.  °[place]  [facility] [place]  [address], [place] (336) 641-7733 Accepts children up to age 21 who are enrolled in Medicaid or [place] Health Choice; pregnant women with a Medicaid card; and children who have applied for Medicaid or [place] Health Choice, but were declined, whose parents can pay a reduced fee at time of service.  °[facility] ° [address], [place] (336) 641-4533 Patients are seen by appointment only. Walk-ins are not accepted. [facility] will see patients 18 years of age and older. °Monday - Tuesday (8am-5pm) °Most Wednesdays (8:30-5pm) °$30 per visit, cash only  °[facility] ° [address], [place] (336) 641-4533 Patients are seen by appointment only. Walk-ins are not accepted. [facility] will see patients 18 years of age and older. °One   Wednesday Evening (Monthly: Volunteer Based).  $30 per visit, cash only  °[facility]  (919) 537-3737 for adults; Children under age 4, call Graduate Pediatric Dentistry at (919) [phone]. Children aged 4-14, please call (919) 537-3737 to request a pediatric application. ° Dental services are provided in all areas of dental care including fillings, crowns and bridges, complete and partial dentures, implants, gum treatment, root canals, and extractions. Preventive care is also provided. Treatment is provided to both adults and children. °Patients are selected via a lottery and there is often a waiting list. °  °[facility] [address], °[place] ° (336) 763-8833 [email or web address] °  °[facility] [address], [place], [place] (336)723-1848, Ext. 123 Second and Fourth Thursday of each month, opens at 6:30 AM; Clinic ends at 9 AM.  Patients are seen on a first-come first-served basis, and a limited number are seen during each clinic.  ° °[facility] ° [address], [place], [place] (336) 723-7904    Eligibility Requirements °You must have lived in [place], [place], or [place] counties for at least the last three months. °  You cannot be eligible for state or federal sponsored healthcare insurance, including Veterans Administration, Medicaid, or Medicare. °  You generally cannot be eligible for healthcare insurance through your employer.  °  How to apply: °Eligibility screenings are held every Tuesday and Wednesday afternoon from 1:00 pm until 4:00 pm. You do not need an appointment for the interview!  °[facility] [address], [place], [place] 336-631-2330   °[facility] [facility]  336-342-8273   °[facility]  336-703-3100   °[facility]  336-570-6415   ° °Behavioral Health Resources in the Community: °Intensive Outpatient Programs °Organization         Address  Phone  Notes  °[facility] [address], [place], [place] 336-878-6098   °[facility] [address], [place], [place] 336-832-9800   °[facility] [address], [place], [place] ° 336-882-2125   °[facility] [address],  °[place], [place] 1-800-853-5163 or 336-641-4981   °Substance Abuse Resources °Organization         Address  Phone  Notes  °[facility]  336-882-2125   °[facility]  336-784-9470   °[facility]  336-285-9073   °[facility]  336-845-3988   °Residential & Outpatient Substance Abuse Program  1-800-659-3381   °Psychological Services °Organization         Address  Phone  Notes  ° Health  336- 832-9600   °[facility]  336- 378-7881   °[facility] [address], [place] 1-800-853-5163 or 336-641-4981   ° °Mobile Crisis Teams °Organization         Address  Phone  Notes  °[facility], Mobile Crisis Care Unit  1-877-626-1772   °Assertive °[facility] ° [address]  [place], [place] 336-834-9664   °Sharon DeEsch [address] °[place] 336-554-5454   ° °Self-Help/Support Groups °Organization         Address  Phone             Notes  °[facility] - variety of support groups  336- 373-1402 Call for more information  °Narcotics Anonymous (NA), [facility] [address], °[place]   2 meetings at this location  ° °  Residential Treatment Programs °Organization         Address  Phone  Notes  °[facility] [address],    °[place]  1-866-801-8205   °[facility] ° [address], [place], [place] 704-293-8524   °[facility] [address], [place] 336-845-3988 Admissions: 8am-3pm M-F  °[facility] [address]    °[place], [place] 336-841-1104   °[facility] [address], [place], [place] 336-379-7146   °[facility] [address]  °[place], [place] 336-285-9073   °[facility] Intensive Outpatient [address], [place], [place] 336-852-3033   °[facility] [address]  °[place], [place] 1-877-615-2722 or 336-784-9470   °[facility] [address] [place], [place] 336-227-7417 Accepts Medicaid  °[facility] [address]  °[place] 1-800-659-3381 Substance Abuse/Addiction Treatment  ° °[place] Behavioral Health Resources °Organization         Address  Phone  Notes  °[facility]  (888) 581-9988   °Julie Brannon, PhD [address] [place], [place]   (336) 349-5553 or (336) 951-0000   °[facility]   [address] °[place], [place] (336) 349-4454   °[facility] [address], [place], [place] (336) 342-8316 Insurance/Medicaid/sponsorship through [facility]  °[facility] [address]                                    [place], [place] (336) 342-8316 Therapy/tele-psych/case    °[facility] [address]  ° [place], [place] (336) 349-2233    °Dr. Arfeen  (336) 349-4544   °[facility] [place]  [employer] [place] [facility] 1) [address], [place] °2) [address], [place] °3)  [address], [place] (336) 349-3220 °(336) 342-7768 ° °(336) 342-8140   °[place] [facility] (336) 342-1394 or (336) 342-3537 (After Hours)    ° ° ° °Take the prescriptions as directed.  Apply moist heat or ice to the area(s) of discomfort, for 15 minutes at a time, several times per day for the next few days.  Do not fall asleep on a heating or ice pack.  Call your regular medical doctor today to schedule a follow up appointment within the next 2 days.  Return to the Emergency Department immediately if worsening. ° °

## 2013-11-08 NOTE — ED Notes (Signed)
Still has severe pain with movement, but is better than when she arrived.  Can turn on stretcher a little better now.

## 2013-11-16 ENCOUNTER — Ambulatory Visit (HOSPITAL_COMMUNITY)
Admission: RE | Admit: 2013-11-16 | Discharge: 2013-11-16 | Disposition: A | Discharge status: Home / Self Care | Payer: BC Managed Care – PPO | Source: Ambulatory Visit | Attending: Family Medicine | Admitting: Family Medicine

## 2013-11-16 ENCOUNTER — Other Ambulatory Visit (HOSPITAL_COMMUNITY): Payer: Self-pay | Admitting: Family Medicine

## 2013-11-16 ENCOUNTER — Emergency Department (HOSPITAL_COMMUNITY)
Admission: EM | Admit: 2013-11-16 | Discharge: 2013-11-16 | Disposition: A | Discharge status: Still In Hospital | Payer: BC Managed Care – PPO

## 2013-11-16 DIAGNOSIS — R209 Unspecified disturbances of skin sensation: Secondary | ICD-10-CM | POA: Insufficient documentation

## 2013-11-16 DIAGNOSIS — M5126 Other intervertebral disc displacement, lumbar region: Secondary | ICD-10-CM | POA: Insufficient documentation

## 2013-11-16 DIAGNOSIS — M545 Low back pain, unspecified: Secondary | ICD-10-CM

## 2013-11-16 DIAGNOSIS — M79609 Pain in unspecified limb: Secondary | ICD-10-CM | POA: Insufficient documentation

## 2019-05-17 ENCOUNTER — Other Ambulatory Visit: Payer: Self-pay

## 2019-05-17 ENCOUNTER — Ambulatory Visit (HOSPITAL_COMMUNITY)
Admission: RE | Admit: 2019-05-17 | Discharge: 2019-05-17 | Disposition: A | Discharge status: Home / Self Care | Payer: Self-pay | Source: Ambulatory Visit | Attending: Internal Medicine | Admitting: Internal Medicine

## 2019-05-17 ENCOUNTER — Other Ambulatory Visit: Payer: Self-pay | Admitting: Internal Medicine

## 2019-05-17 DIAGNOSIS — M79604 Pain in right leg: Secondary | ICD-10-CM

## 2019-08-05 ENCOUNTER — Emergency Department (HOSPITAL_COMMUNITY)
Admission: EM | Admit: 2019-08-05 | Discharge: 2019-08-05 | Disposition: A | Discharge status: Home / Self Care | Payer: Self-pay | Attending: Emergency Medicine | Admitting: Emergency Medicine

## 2019-08-05 ENCOUNTER — Encounter (HOSPITAL_COMMUNITY): Payer: Self-pay | Admitting: *Deleted

## 2019-08-05 ENCOUNTER — Other Ambulatory Visit: Payer: Self-pay

## 2019-08-05 ENCOUNTER — Emergency Department (HOSPITAL_COMMUNITY): Payer: Self-pay

## 2019-08-05 DIAGNOSIS — Y929 Unspecified place or not applicable: Secondary | ICD-10-CM | POA: Insufficient documentation

## 2019-08-05 DIAGNOSIS — Y9301 Activity, walking, marching and hiking: Secondary | ICD-10-CM | POA: Insufficient documentation

## 2019-08-05 DIAGNOSIS — J45909 Unspecified asthma, uncomplicated: Secondary | ICD-10-CM | POA: Insufficient documentation

## 2019-08-05 DIAGNOSIS — W25XXXA Contact with sharp glass, initial encounter: Secondary | ICD-10-CM | POA: Insufficient documentation

## 2019-08-05 DIAGNOSIS — S61412A Laceration without foreign body of left hand, initial encounter: Secondary | ICD-10-CM | POA: Insufficient documentation

## 2019-08-05 DIAGNOSIS — F17210 Nicotine dependence, cigarettes, uncomplicated: Secondary | ICD-10-CM | POA: Insufficient documentation

## 2019-08-05 DIAGNOSIS — Z79899 Other long term (current) drug therapy: Secondary | ICD-10-CM | POA: Insufficient documentation

## 2019-08-05 DIAGNOSIS — Y998 Other external cause status: Secondary | ICD-10-CM | POA: Insufficient documentation

## 2019-08-05 MED ORDER — POVIDONE-IODINE 10 % EX SOLN
Freq: Once | CUTANEOUS | Status: DC
  Filled 2019-08-05: qty 30

## 2019-08-05 MED ORDER — TETANUS-DIPHTH-ACELL PERTUSSIS 5-2.5-18.5 LF-MCG/0.5 IM SUSP
0.5000 mL | Freq: Once | INTRAMUSCULAR | Status: AC
  Administered 2019-08-05: 13:00:00 0.5 mL via INTRAMUSCULAR
  Filled 2019-08-05: qty 0.5

## 2019-08-05 MED ORDER — SULFAMETHOXAZOLE-TRIMETHOPRIM 800-160 MG PO TABS: 1.0000 | ORAL_TABLET | Freq: Two times a day (BID) | ORAL | 0 refills | Status: AC

## 2019-08-05 MED ORDER — LIDOCAINE HCL (PF) 1 % IJ SOLN
10.0000 mL | Freq: Once | INTRAMUSCULAR | Status: AC
  Administered 2019-08-05: 13:00:00 10 mL

## 2019-08-05 MED ORDER — LIDOCAINE HCL (PF) 1 % IJ SOLN
INTRAMUSCULAR | Status: AC
  Filled 2019-08-05: qty 10

## 2019-08-05 NOTE — Discharge Instructions (Signed)
Suture removal in 8-10 days

## 2019-08-05 NOTE — ED Notes (Signed)
Pt reports she is unable to sign due to she is left handed

## 2019-08-05 NOTE — ED Provider Notes (Signed)
[facility] EMERGENCY DEPARTMENT Provider Note   CSN: [id number] Arrival date & time: 08/05/19  1119     History   Chief Complaint Chief Complaint  Patient presents with  . Laceration    left hand    HPI Jessica [name] is a 53 y.o. female.     The history is provided by the patient. No language interpreter was used.  Laceration Location:  Hand Hand laceration location:  L hand Length:  2.5 Depth:  Through dermis Quality: jagged   Laceration mechanism:  Fall Pain details:    Quality:  Aching   Severity:  Moderate   Timing:  Intermittent   Progression:  Worsening Relieved by:  Nothing Ineffective treatments:  None tried Tetanus status:  Unknown Associated symptoms: no fever   Pt fell this am 1:00 with a glass bottle in her hand.  Pt complains of a laceration  Past Medical History:  Diagnosis Date  . Asthma   . Chronic back pain   . GERD (gastroesophageal reflux disease)   . Sciatica     Patient Active Problem List   Diagnosis Date Noted  . UPPER RESPIRATORY INFECTION, VIRAL 03/27/2009  . VARICOSE VEINS, LOWER EXTREMITIES 10/12/2007  . NEOPLASM, SKIN, UNCERTAIN BEHAVIOR [id number]  . PVD 07/06/2007  . ASTHMA 07/06/2007  . GERD 07/06/2007    Past Surgical History:  Procedure Laterality Date  . CESAREAN SECTION    . VEIN SURGERY       OB History   No obstetric history on file.      Home Medications    Prior to Admission medications   Medication Sig Start Date End Date Taking? Authorizing Provider  amoxicillin (AMOXIL) 500 MG capsule Take 1 capsule (500 mg total) by mouth 3 (three) times daily. 11/01/13   Milton Ferguson, MD  aspirin 325 MG tablet Take 325 mg by mouth daily as needed for pain.    [provider]  EQL SALINE NASAL SPRAY NA Place 1 spray into the nose as needed (for congestion).    [provider]  methocarbamol (ROBAXIN) 500 MG tablet Take 2 tablets (1,000 mg total) by mouth 4 (four) times daily as needed for  muscle spasms (muscle spasm/pain). 11/08/13   Francine Graven, DO  methylPREDNIsolone (MEDROL DOSPACK) 4 MG tablet follow package directions 11/08/13   Francine Graven, DO  naproxen sodium (ALEVE) 220 MG tablet Take 220 mg by mouth 2 (two) times daily as needed.    [provider]  oxyCODONE-acetaminophen (PERCOCET/ROXICET) 5-325 MG per tablet 1 or 2 tabs PO q6h prn pain 11/08/13   Francine Graven, DO  sulfamethoxazole-trimethoprim (BACTRIM DS) 800-160 MG tablet Take 1 tablet by mouth 2 (two) times daily for 7 days. 08/05/19 08/12/19  Fransico Meadow, PA-C    Family History History reviewed. No pertinent family history.  Social History Social History   Tobacco Use  . Smoking status: Current Every Day Smoker    Types: Cigarettes  . Smokeless tobacco: Never Used  Substance Use Topics  . Alcohol use: Yes    Comment: occassional  . Drug use: No     Allergies   Hydrocodone   Review of Systems Review of Systems  Constitutional: Negative for fever.  All other systems reviewed and are negative.    Physical Exam Updated Vital Signs BP (!) 146/96 (BP Location: Left Arm)   Pulse 94   Temp 98.2 F (36.8 C) (Oral)   Resp 17   Ht 5\' 6"  (1.676 m)  Wt 120.2 kg   LMP  (LMP Unknown)   SpO2 96%   BMI 42.77 kg/m   Physical Exam Vitals signs and nursing note reviewed.  Musculoskeletal: Normal range of motion.  Skin:    Comments: 2.5 cm laceration hand between 4th and 5th finger from  nv and ns intact  Neurological:     General: No focal deficit present.     Mental Status: She is alert.  Psychiatric:        Mood and Affect: Mood normal.      ED Treatments / Results  Labs (all labs ordered are listed, but only abnormal results are displayed) Labs Reviewed - No data to display  EKG None  Radiology Dg Hand Complete Left  Result Date: 08/05/2019 CLINICAL DATA:  fall approximately 0100 this am while holding a glass bottle that broke while in left hand, cut  at left distal 4th, 5th metacarpal area EXAM: LEFT HAND - COMPLETE 3+ VIEW COMPARISON:  None. FINDINGS: There is no evidence of fracture or dislocation. No significant arthropathy or other focal bony lesion. There is a soft tissue defect at the inter web of the fourth and fifth fingers likely accounting for the reported laceration. No radiopaque foreign body is identified. IMPRESSION: Soft tissue defect between the fourth and fifth fingers likely representing laceration. No radiopaque foreign body or underlying bony abnormality. Electronically Signed   By: Audie Pinto M.D.   On: 08/05/2019 12:48    Procedures .Marland KitchenLaceration Repair  Date/Time: 08/05/2019 1:27 PM Performed by: Fransico Meadow, PA-C Authorized by: Fransico Meadow, PA-C   Consent:    Consent obtained:  Verbal   Consent given by:  Patient   Risks discussed:  Infection and retained foreign body   Alternatives discussed:  No treatment Anesthesia (see MAR for exact dosages):    Anesthesia method:  Local infiltration   Local anesthetic:  Lidocaine 1% w/o epi Laceration details:    Location:  Hand   Hand location:  L palm   Length (cm):  3   Depth (mm):  2.5 Repair type:    Repair type:  Simple Pre-procedure details:    Preparation:  Patient was prepped and draped in usual sterile fashion Exploration:    Wound exploration: wound explored through full range of motion     Contaminated: no   Treatment:    Area cleansed with:  Betadine   Irrigation solution:  Sterile saline Skin repair:    Repair method:  Sutures   Suture size:  5-0   Suture material:  Prolene   Suture technique:  Simple interrupted   Number of sutures:  7 Approximation:    Approximation:  Loose Post-procedure details:    Patient tolerance of procedure:  Tolerated well, no immediate complications   (including critical care time)  Medications Ordered in ED Medications  povidone-iodine (BETADINE) 10 % external solution (2 application Topical  Handoff 08/05/19 1302)  Tdap (BOOSTRIX) injection 0.5 mL (0.5 mLs Intramuscular Given 08/05/19 1245)  lidocaine (PF) (XYLOCAINE) 1 % injection 10 mL (10 mLs Infiltration Given 08/05/19 1246)     Initial Impression / Assessment and Plan / ED Course  I have reviewed the triage vital signs and the nursing notes.  Pertinent labs & imaging results that were available during my care of the patient were reviewed by me and considered in my medical decision making (see chart for details).        MDM   Pt counseled on wound care and suture  removal   Final Clinical Impressions(s) / ED Diagnoses   Final diagnoses:  Laceration of left hand without foreign body, initial encounter    ED Discharge Orders         Ordered    sulfamethoxazole-trimethoprim (BACTRIM DS) 800-160 MG tablet  2 times daily     08/05/19 1321        An After Visit Summary was printed and given to the patient.    Fransico Meadow, [place] 08/05/19 1329    Fredia Sorrow, MD 08/07/19 1811

## 2019-08-05 NOTE — ED Notes (Signed)
Jagged lac to palmar surface L hand

## 2019-08-05 NOTE — ED Triage Notes (Signed)
Patient presents to the ED after sustaining a fall approximately 0100 this am while holding a glass bottle that broke while in hand.

## 2019-08-05 NOTE — ED Notes (Signed)
Pt partying and fell down stairs with a bottle breaking bottle and now with jagged lac to L hand  Pt DT unknown  Fell on or about 0100

## 2019-10-31 IMAGING — US RIGHT LOWER EXTREMITY VENOUS ULTRASOUND
1 series · 13 of 24 positions shown · non-contrast
Comparison: 09/24/2006.

CLINICAL DATA: Evaluate for DVT.



[Series 1: right lower extremity venous ultrasound · 0.08mm/px · 13 of 60 slices shown]
[im 1/60]
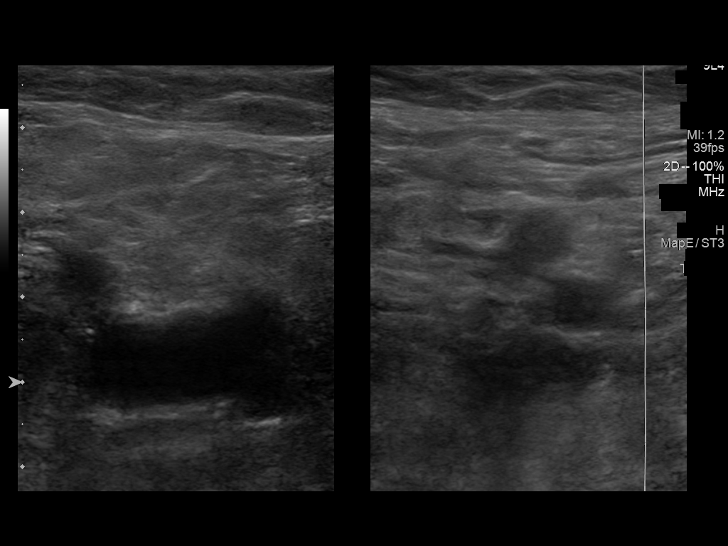
[im 6/60]
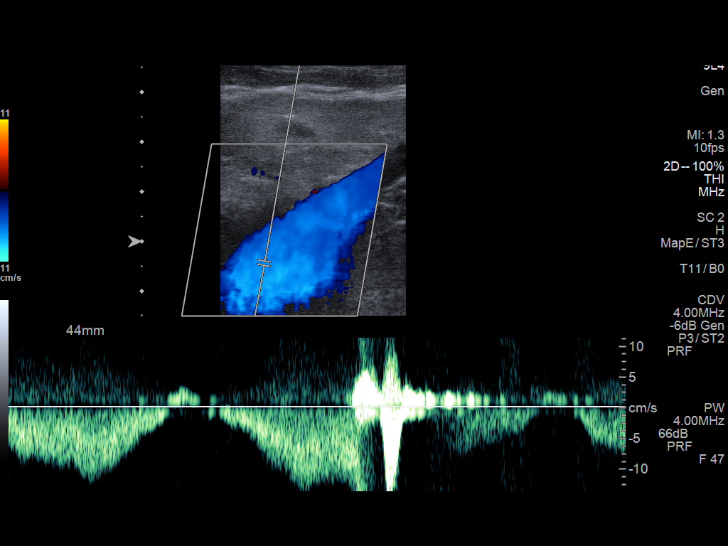
[im 11/60]
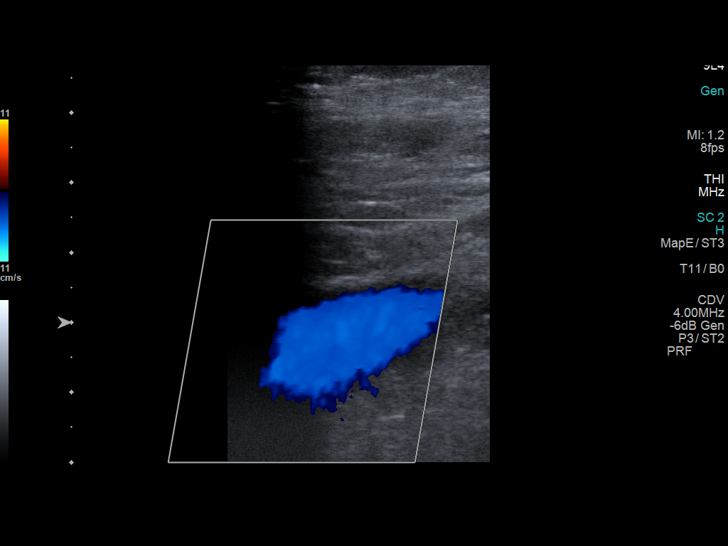
[im 16/60]
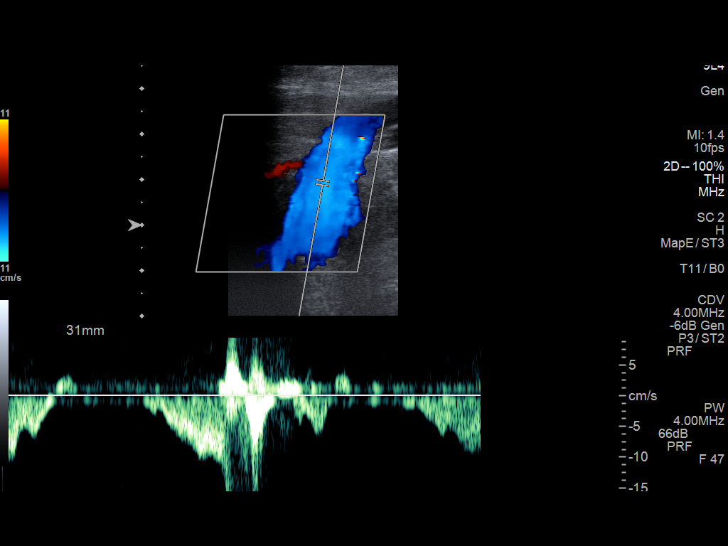
[im 21/60]
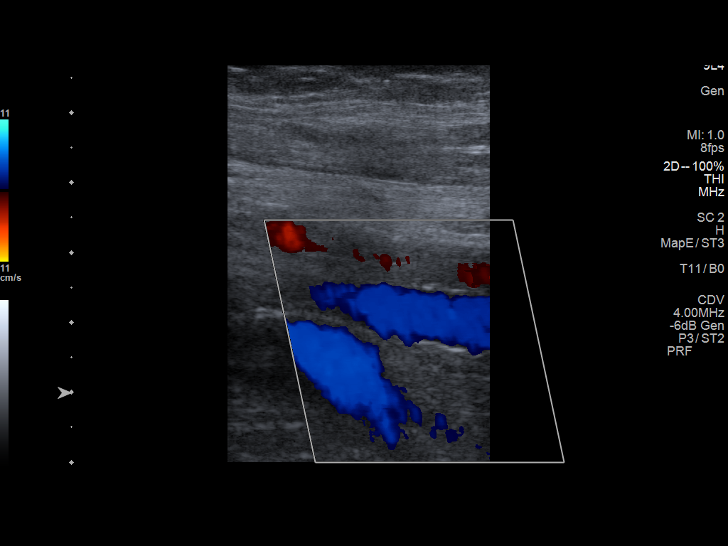
[im 26/60]
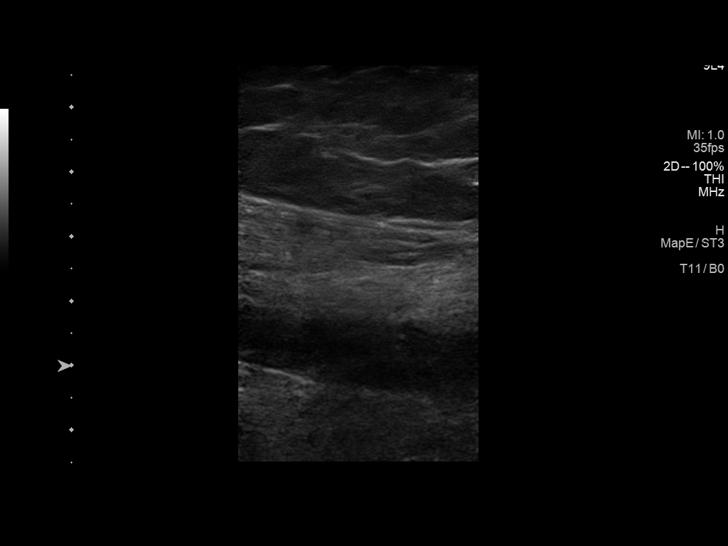
[im 31/60]
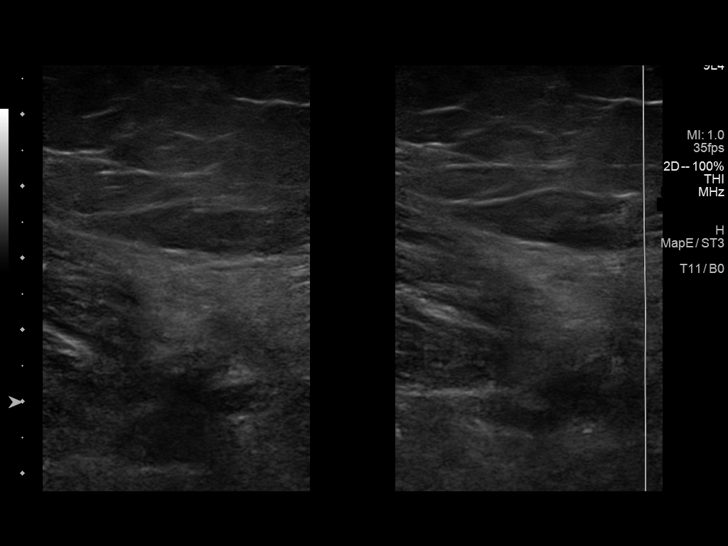
[im 34/60]
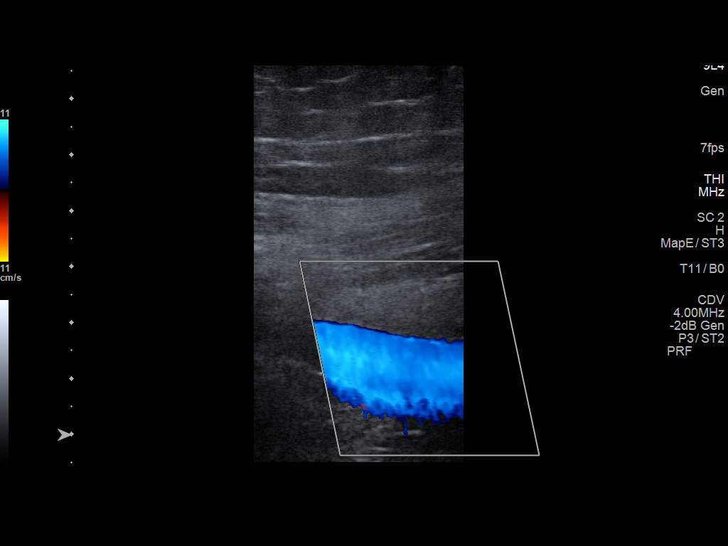
[im 39/60]
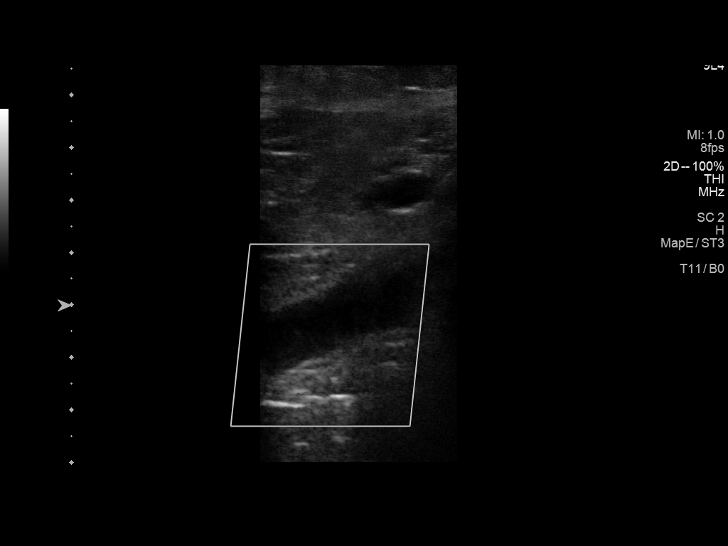
[im 44/60]
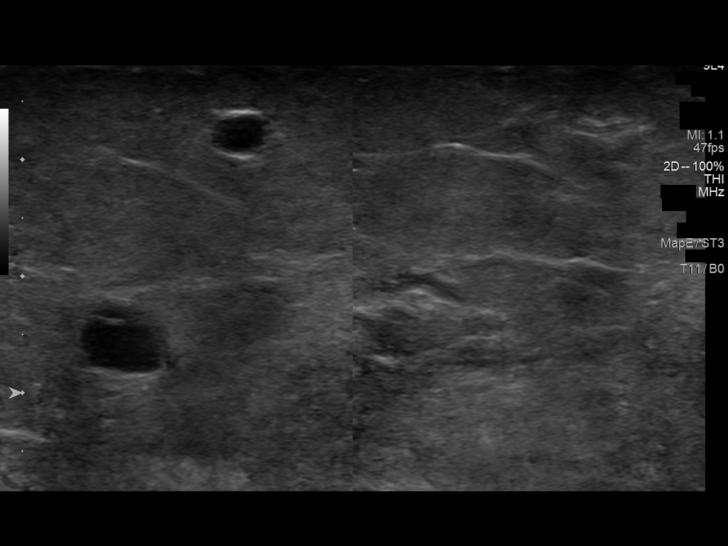
[im 49/60]
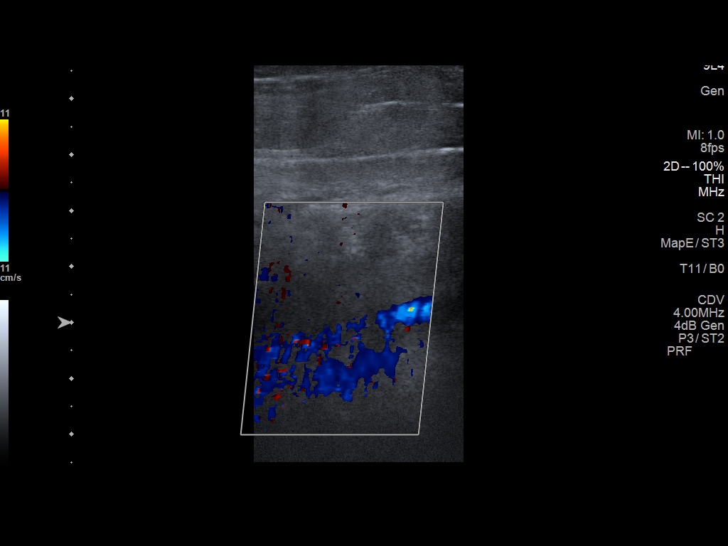
[im 54/60]
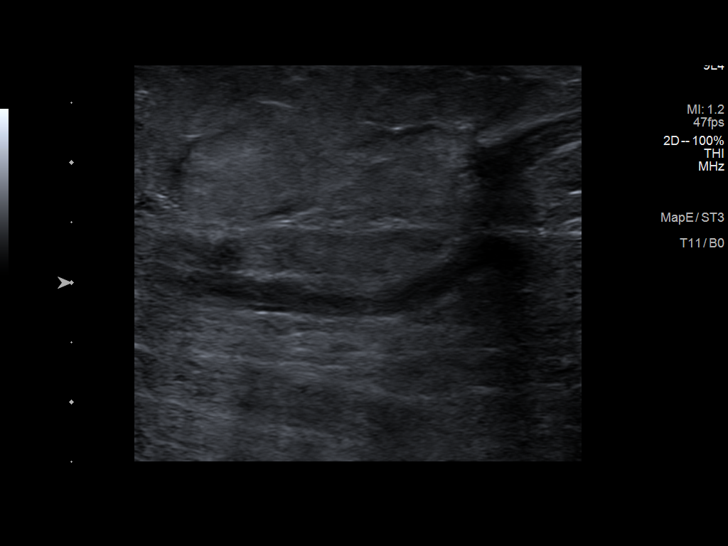
[im 60/60]
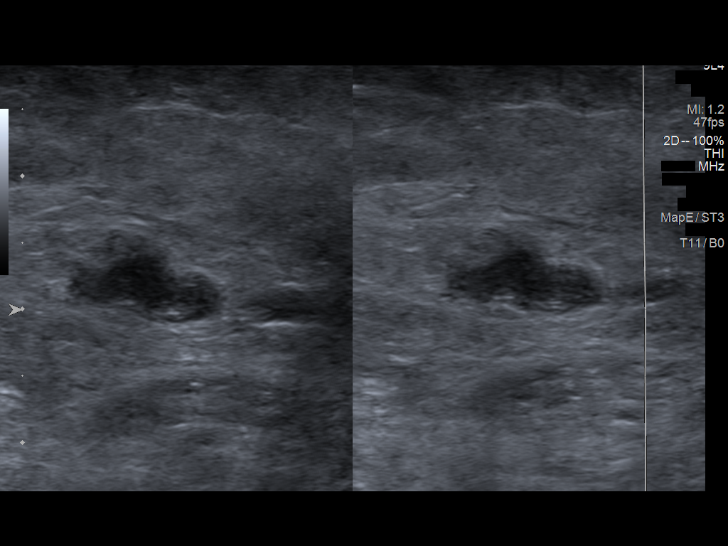

[13 of 24 positions shown; findings below may reference images not displayed]

FINDINGS: Contralateral Common Femoral Vein: Respiratory phasicity is normal
and symmetric with the symptomatic side. No evidence of thrombus.
Normal compressibility.

Common Femoral Vein: No evidence of thrombus. Normal
compressibility, respiratory phasicity and response to augmentation.

Saphenofemoral Junction: No evidence of thrombus. Normal
compressibility and flow on color Doppler imaging.

Profunda Femoral Vein: No evidence of thrombus. Normal
compressibility and flow on color Doppler imaging.

Femoral Vein: No evidence of thrombus. Normal compressibility,
respiratory phasicity and response to augmentation.

Popliteal Vein: No evidence of thrombus. Normal compressibility,
respiratory phasicity and response to augmentation.

Calf Veins: No evidence of thrombus. Normal compressibility and flow
on color Doppler imaging.

Other Findings: Superficial venous thrombus noted in a calf
varicosity.
IMPRESSION: 1.  No evidence of deep venous thrombosis.

2.  Superficial venous thrombus noted in a calf varicosity.

## 2020-01-19 IMAGING — DX DG HAND COMPLETE 3+V*L*
3 series · 3 of 3 positions shown · non-contrast
Comparison: None.

CLINICAL DATA: fall approximately 7177 this am while holding a
glass bottle that broke while in left hand, cut at left distal 4th,
5th metacarpal area

EXAM:
LEFT HAND - COMPLETE 3+ VIEW

[hand pa]
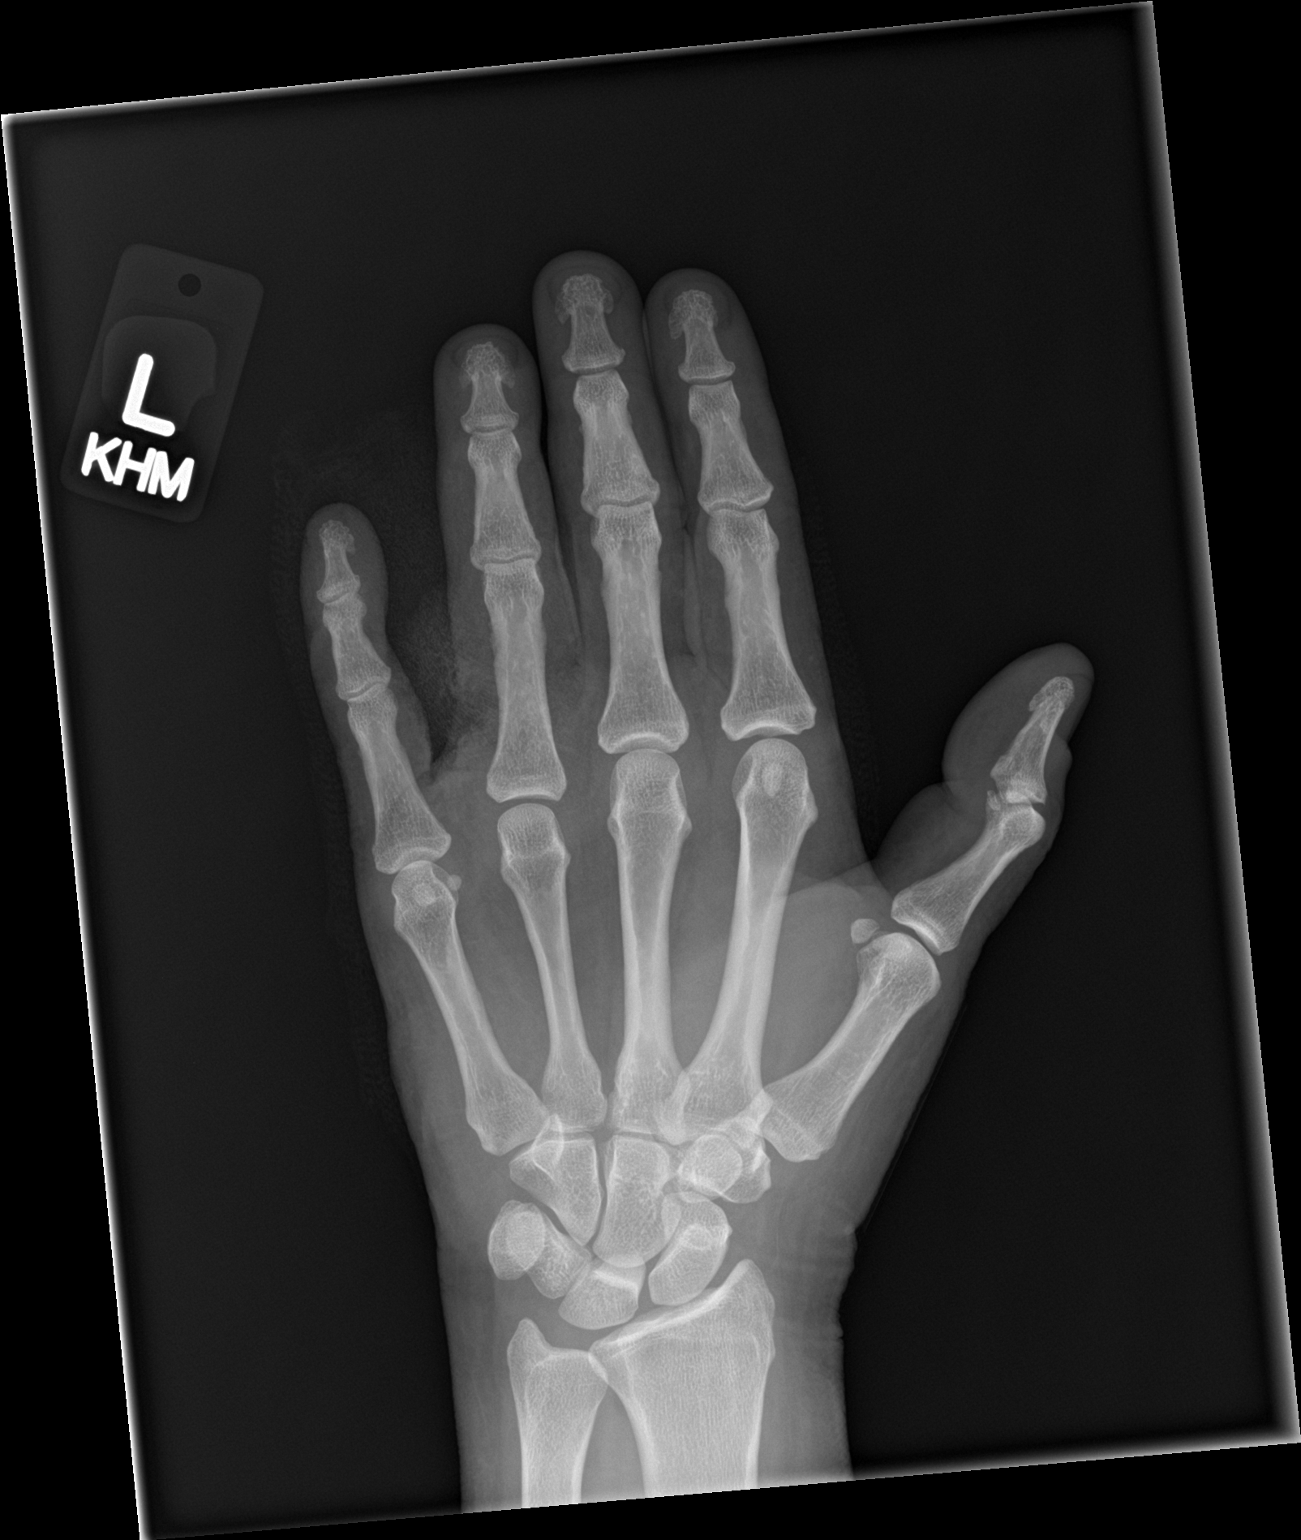

[hand obl]
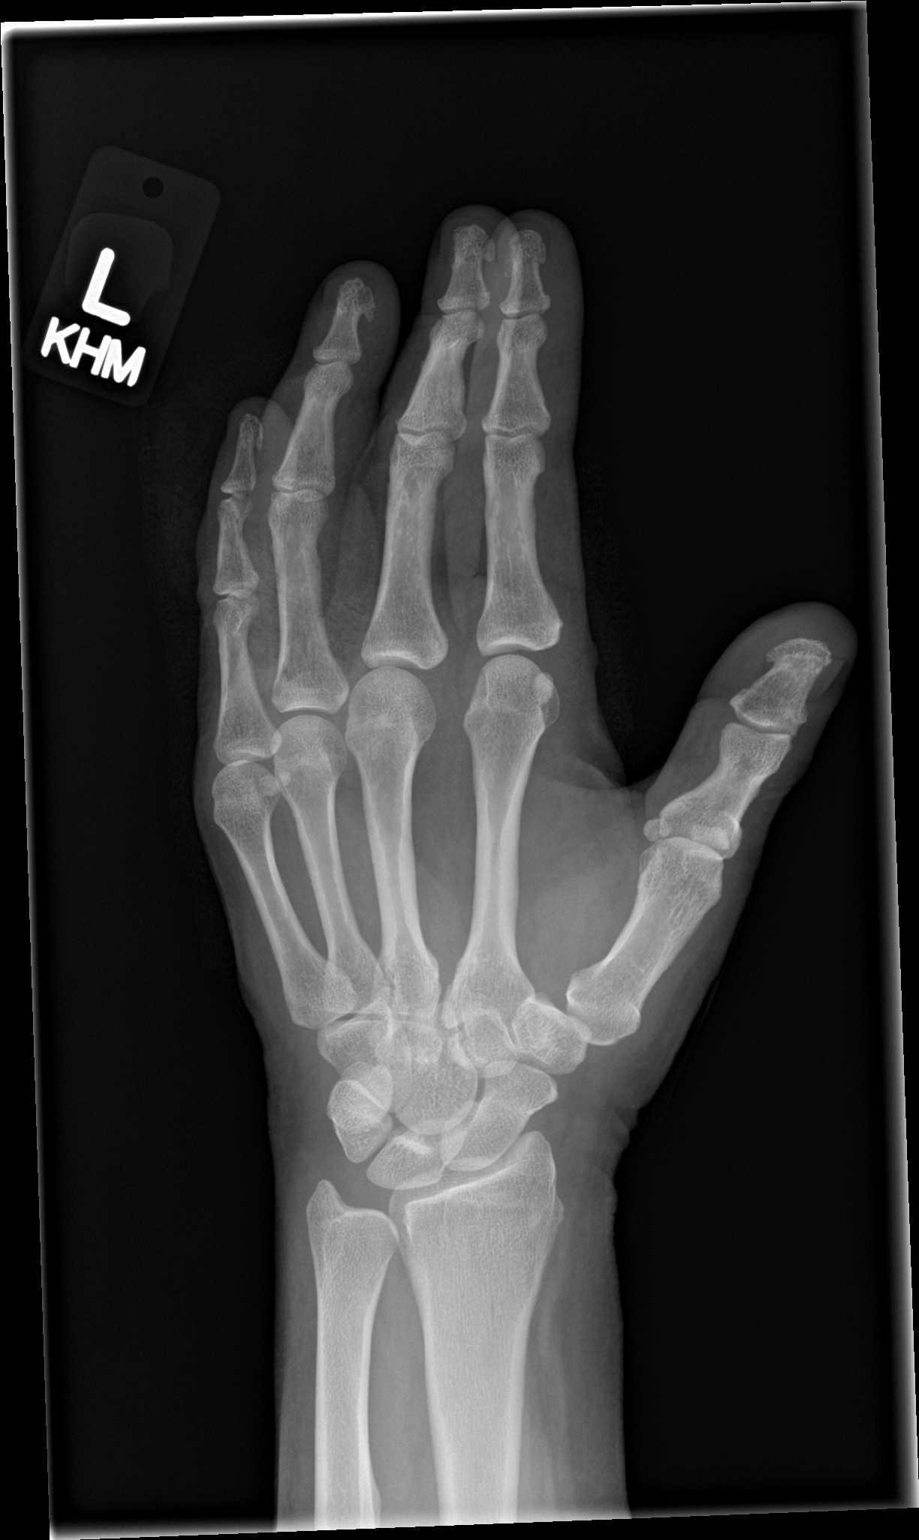

[hand lat]
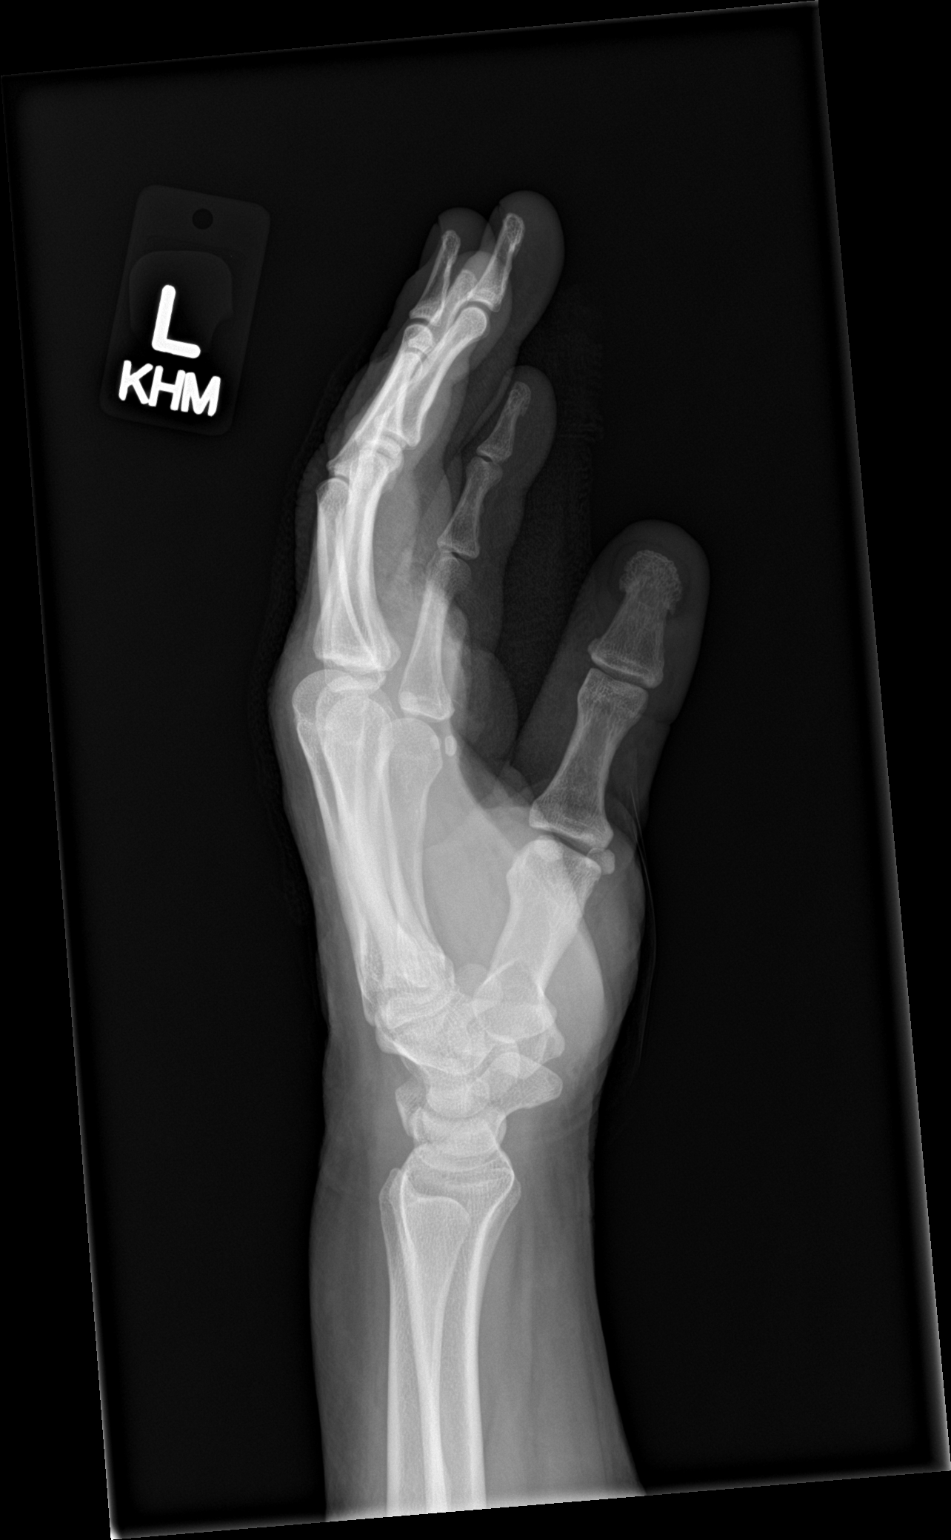

[3 of 3 positions shown; findings below may reference images not displayed]

FINDINGS: There is no evidence of fracture or dislocation. No significant
arthropathy or other focal bony lesion. There is a soft tissue
defect at the inter web of the fourth and fifth fingers likely
accounting for the reported laceration. No radiopaque foreign body
is identified.
IMPRESSION: Soft tissue defect between the fourth and fifth fingers likely
representing laceration. No radiopaque foreign body or underlying
bony abnormality.

## 2020-07-16 DIAGNOSIS — A4902 Methicillin resistant Staphylococcus aureus infection, unspecified site: Secondary | ICD-10-CM | POA: Diagnosis not present

## 2020-07-16 DIAGNOSIS — E669 Obesity, unspecified: Secondary | ICD-10-CM | POA: Diagnosis not present

## 2020-07-16 DIAGNOSIS — E119 Type 2 diabetes mellitus without complications: Secondary | ICD-10-CM | POA: Diagnosis not present

## 2020-07-16 DIAGNOSIS — Z6841 Body Mass Index (BMI) 40.0 and over, adult: Secondary | ICD-10-CM | POA: Diagnosis not present

## 2020-07-16 DIAGNOSIS — F17200 Nicotine dependence, unspecified, uncomplicated: Secondary | ICD-10-CM | POA: Diagnosis not present

## 2020-07-16 DIAGNOSIS — Z0001 Encounter for general adult medical examination with abnormal findings: Secondary | ICD-10-CM | POA: Diagnosis not present

## 2020-07-18 ENCOUNTER — Other Ambulatory Visit (HOSPITAL_COMMUNITY): Payer: Self-pay | Admitting: Family Medicine

## 2020-07-18 DIAGNOSIS — Z1231 Encounter for screening mammogram for malignant neoplasm of breast: Secondary | ICD-10-CM

## 2020-08-29 ENCOUNTER — Encounter: Payer: BC Managed Care – PPO | Admitting: Adult Health

## 2020-09-11 ENCOUNTER — Ambulatory Visit (INDEPENDENT_AMBULATORY_CARE_PROVIDER_SITE_OTHER): Payer: BC Managed Care – PPO | Admitting: Adult Health

## 2020-09-11 ENCOUNTER — Encounter: Payer: Self-pay | Admitting: Adult Health

## 2020-09-11 ENCOUNTER — Other Ambulatory Visit (HOSPITAL_COMMUNITY)
Admission: RE | Admit: 2020-09-11 | Discharge: 2020-09-11 | Disposition: A | Discharge status: Home / Self Care | Payer: BC Managed Care – PPO | Source: Ambulatory Visit | Attending: Adult Health | Admitting: Adult Health

## 2020-09-11 ENCOUNTER — Other Ambulatory Visit: Payer: Self-pay

## 2020-09-11 VITALS — BP 142/76 | HR 72 | Ht 66.0 in | Wt 257.4 lb

## 2020-09-11 DIAGNOSIS — Z124 Encounter for screening for malignant neoplasm of cervix: Secondary | ICD-10-CM

## 2020-09-11 DIAGNOSIS — N950 Postmenopausal bleeding: Secondary | ICD-10-CM

## 2020-09-11 DIAGNOSIS — Z01419 Encounter for gynecological examination (general) (routine) without abnormal findings: Secondary | ICD-10-CM

## 2020-09-11 DIAGNOSIS — Z1211 Encounter for screening for malignant neoplasm of colon: Secondary | ICD-10-CM

## 2020-09-11 LAB — HEMOCCULT GUIAC POC 1CARD (OFFICE): Fecal Occult Blood, POC: NEGATIVE

## 2020-09-11 NOTE — Progress Notes (Signed)
Patient ID: Jessica [name], female   DOB: [birth year], 54 y.o.   MRN: [id number] History of Present Illness: Satonya is a 54 year old white female,married, PM, in for well woman gyn exam and pap. Last pap over 10 years ago, and she has had some spotting. PCP is [place].   Current Medications, Allergies, Past Medical History, Past Surgical History, Family History and Social History were reviewed in [employer] record.     Review of Systems: Patient denies any headaches, hearing loss, fatigue, blurred vision, shortness of breath, chest pain, abdominal pain, problems with bowel movements(can wipe stool at times after having BM), urination, or intercourse(not active). No joint pain or mood swings. Has pain in right hip at times  Vaginal spotting, none now  Physical Exam:BP (!) 142/76 (BP Location: Right Arm, Patient Position: Sitting, Cuff Size: Large)    Pulse 72    Ht 5\' 6"  (1.676 m)    Wt 257 lb 6.4 oz (116.8 kg)    LMP 04/09/2020 (Approximate) Comment: spotting   BMI 41.55 kg/m  General:  Well developed, well nourished, no acute distress Skin:  Warm and dry Neck:  Midline trachea, normal thyroid, good ROM, no lymphadenopathy Lungs; Clear to auscultation bilaterally Breast:  No dominant palpable mass, retraction, or nipple discharge Cardiovascular: Regular rate and rhythm Abdomen:  Soft, non tender, no hepatosplenomegaly Pelvic:  External genitalia is normal in appearance, no lesions.  The vagina is normal in appearance. Urethra has no lesions or masses. The cervix is smooth, pap with high risk HPV genotyping performed.  Uterus is felt to be normal size, shape, and contour.  No adnexal masses or tenderness noted.Bladder is non tender, no masses felt. Rectal: Good sphincter tone, no polyps,+ hemorrhoids felt.  Hemoccult negative. Extremities/musculoskeletal:  No swelling, + varicosities noted, no clubbing or cyanosis Psych:  No mood changes, alert and cooperative,seems  happy AA is 4 Fall risk is low PHQ 9 score is 0  Upstream - 09/11/20 1100      Pregnancy Intention Screening   Does the patient want to become pregnant in the next year? No    Does the patient's partner want to become pregnant in the next year? No    Would the patient like to discuss contraceptive options today? No      Contraception Wrap Up   Current Method No Method - Other Reason   pM   End Method No Method - Other Reason   PM   Contraception Counseling Provided No         Examination chaperoned by [employer].   Impression and Plan: 1. Routine cervical smear Pap sent  2. Encounter for gynecological examination with Papanicolaou smear of cervix Pap sent Physical with PCP Pap in 3 if normal Mammogram yearly Labs with PCP Pt declines colonoscopy and cologuard   3. Encounter for screening fecal occult blood testing Negative fecal occult test   4. PMB (postmenopausal bleeding) Dicussed getting [place] to assess endometrial thickness, and if thickened may need biopsy  Return in about 12 days for GYN [place], will talk when results back

## 2020-09-13 ENCOUNTER — Other Ambulatory Visit: Payer: Self-pay | Admitting: Adult Health

## 2020-09-13 LAB — CYTOLOGY - PAP
Adequacy: ABSENT
Comment: NEGATIVE
Diagnosis: NEGATIVE
High risk HPV: NEGATIVE

## 2020-09-16 ENCOUNTER — Telehealth: Payer: Self-pay | Admitting: *Deleted

## 2020-09-16 ENCOUNTER — Other Ambulatory Visit: Payer: Self-pay | Admitting: Adult Health

## 2020-09-16 MED ORDER — METRONIDAZOLE 500 MG PO TABS: 500.0000 mg | ORAL_TABLET | Freq: Two times a day (BID) | ORAL | 0 refills | Status: DC

## 2020-09-16 NOTE — Telephone Encounter (Signed)
Pt aware BV showed up on pap and med has been sent to pharmacy, otherwise pap was normal and negative HPV. Pt voiced understanding. [place]

## 2020-09-16 NOTE — Telephone Encounter (Signed)
Left message @ 9:00 am. JSY

## 2020-09-16 NOTE — Progress Notes (Signed)
rx flagyl +BV on pap

## 2020-09-16 NOTE — Telephone Encounter (Signed)
-----   Message from Estill Dooms, NP sent at 09/16/2020  8:43 AM EST ----- Let pt know that I sent in rx for flagyl, had BV on pap, which was negative for HPV and malignancy

## 2020-09-23 ENCOUNTER — Other Ambulatory Visit: Payer: BC Managed Care – PPO

## 2021-05-02 ENCOUNTER — Other Ambulatory Visit: Payer: Self-pay

## 2021-05-02 ENCOUNTER — Emergency Department (HOSPITAL_COMMUNITY)
Admission: EM | Admit: 2021-05-02 | Discharge: 2021-05-02 | Disposition: A | Discharge status: Home / Self Care | Payer: BC Managed Care – PPO | Attending: Emergency Medicine | Admitting: Emergency Medicine

## 2021-05-02 ENCOUNTER — Ambulatory Visit
Admission: EM | Admit: 2021-05-02 | Discharge: 2021-05-02 | Disposition: A | Discharge status: Home / Self Care | Payer: BC Managed Care – PPO

## 2021-05-02 ENCOUNTER — Encounter (HOSPITAL_COMMUNITY): Payer: Self-pay | Admitting: Emergency Medicine

## 2021-05-02 DIAGNOSIS — M79661 Pain in right lower leg: Secondary | ICD-10-CM | POA: Insufficient documentation

## 2021-05-02 DIAGNOSIS — E119 Type 2 diabetes mellitus without complications: Secondary | ICD-10-CM | POA: Diagnosis not present

## 2021-05-02 DIAGNOSIS — Z8603 Personal history of neoplasm of uncertain behavior: Secondary | ICD-10-CM | POA: Diagnosis not present

## 2021-05-02 DIAGNOSIS — J45909 Unspecified asthma, uncomplicated: Secondary | ICD-10-CM | POA: Insufficient documentation

## 2021-05-02 DIAGNOSIS — F17210 Nicotine dependence, cigarettes, uncomplicated: Secondary | ICD-10-CM | POA: Diagnosis not present

## 2021-05-02 DIAGNOSIS — M79604 Pain in right leg: Secondary | ICD-10-CM

## 2021-05-02 LAB — CBC WITH DIFFERENTIAL/PLATELET
Abs Immature Granulocytes: 0.07 10*3/uL (ref 0.00–0.07)
Basophils Absolute: 0.1 10*3/uL (ref 0.0–0.1)
Basophils Relative: 1 %
Eosinophils Absolute: 0.2 10*3/uL (ref 0.0–0.5)
Eosinophils Relative: 2 %
HCT: 45.9 % (ref 36.0–46.0)
Hemoglobin: 15.2 g/dL — ABNORMAL HIGH (ref 12.0–15.0)
Immature Granulocytes: 1 %
Lymphocytes Relative: 26 %
Lymphs Abs: 2.6 10*3/uL (ref 0.7–4.0)
MCH: 29.7 pg (ref 26.0–34.0)
MCHC: 33.1 g/dL (ref 30.0–36.0)
MCV: 89.6 fL (ref 80.0–100.0)
Monocytes Absolute: 1 10*3/uL (ref 0.1–1.0)
Monocytes Relative: 10 %
Neutro Abs: 5.9 10*3/uL (ref 1.7–7.7)
Neutrophils Relative %: 60 %
Platelets: 298 10*3/uL (ref 150–400)
RBC: 5.12 MIL/uL — ABNORMAL HIGH (ref 3.87–5.11)
RDW: 13.7 % (ref 11.5–15.5)
WBC: 9.9 10*3/uL (ref 4.0–10.5)
nRBC: 0 % (ref 0.0–0.2)

## 2021-05-02 LAB — BASIC METABOLIC PANEL
Anion gap: 10 (ref 5–15)
BUN: 14 mg/dL (ref 6–20)
CO2: 24 mmol/L (ref 22–32)
Calcium: 8.9 mg/dL (ref 8.9–10.3)
Chloride: 103 mmol/L (ref 98–111)
Creatinine, Ser: 0.54 mg/dL (ref 0.44–1.00)
GFR, Estimated: >60 mL/min (ref >60)
Glucose, Bld: 133 mg/dL — ABNORMAL HIGH (ref 70–99)
Potassium: 4 mmol/L (ref 3.5–5.1)
Sodium: 137 mmol/L (ref 135–145)

## 2021-05-02 MED ORDER — APIXABAN 5 MG PO TABS: 10.0000 mg | ORAL_TABLET | Freq: Two times a day (BID) | ORAL | Status: DC

## 2021-05-02 MED ORDER — APIXABAN 5 MG PO TABS: 5.0000 mg | ORAL_TABLET | Freq: Two times a day (BID) | ORAL | Status: DC

## 2021-05-02 MED ORDER — APIXABAN 5 MG PO TABS
10.0000 mg | ORAL_TABLET | Freq: Two times a day (BID) | ORAL | Status: DC
  Administered 2021-05-02: 10 mg via ORAL
  Filled 2021-05-02: qty 2

## 2021-05-02 NOTE — ED Provider Notes (Signed)
[facility] EMERGENCY DEPARTMENT Provider Note   CSN: [id number] Arrival date & time: 05/02/21  1523     History Chief Complaint  Patient presents with   Leg Pain    Jessica [name] is a 55 y.o. female.  HPI  Patient with no significant medical history presents to the emergency department with chief complaint of right calf pain.  Patient states this started a few days ago, states the pain came on suddenly, she has a knot in the back of her calf, she denies paresthesias or weakness in the lower extremities, states that she recent got back from the trip and was in the car for a long time.  She went to urgent care where they were concerned for possible DVT and sent her here for further evaluation.  Patient states she has no history of PEs or DVTs, she is currently not on hormone therapy, she denies chest pain or shortness of breath.  She has no other complaints at this time.  She does not endorse fevers, chills, abdominal pain nausea vomiting diarrhea.  Past Medical History:  Diagnosis Date   Asthma    Chronic back pain    Diabetes mellitus without complication ([place])    Changed diet and A1C normal   GERD (gastroesophageal reflux disease)    Sciatica     Patient Active Problem List   Diagnosis Date Noted   PMB (postmenopausal bleeding) 09/11/2020   Encounter for screening fecal occult blood testing 09/11/2020   Encounter for gynecological examination with Papanicolaou smear of cervix 09/11/2020   Routine cervical smear 09/11/2020   UPPER RESPIRATORY INFECTION, VIRAL 03/27/2009   VARICOSE VEINS, LOWER EXTREMITIES 10/12/2007   NEOPLASM, SKIN, UNCERTAIN BEHAVIOR 43/32/9518   PVD 07/06/2007   ASTHMA 07/06/2007   GERD 07/06/2007    Past Surgical History:  Procedure Laterality Date   CESAREAN SECTION     VEIN SURGERY       OB History     Gravida  3   Para  2   Term  2   Preterm      AB  1   Living  2      SAB  1   IAB      Ectopic      Multiple      Live  Births  2           Family History  Problem Relation Age of Onset   Cancer Paternal Grandfather    Cervical cancer Maternal Grandmother    Heart attack Maternal Grandfather    Heart attack Father    Esophageal cancer Brother    Stomach cancer Brother     Social History   Tobacco Use   Smoking status: Every Day    Packs/day: 1.50    Years: 40.00    Pack years: 60.00    Types: Cigarettes   Smokeless tobacco: Never  Vaping Use   Vaping Use: Never used  Substance Use Topics   Alcohol use: Yes    Comment: occassional   Drug use: No    Home Medications Prior to Admission medications   Medication Sig Start Date End Date Taking? Authorizing Provider  fish oil-omega-3 fatty acids 1000 MG capsule Take 2 g by mouth daily.    [provider]  metroNIDAZOLE (FLAGYL) 500 MG tablet Take 1 tablet (500 mg total) by mouth 2 (two) times daily. 09/16/20   Estill Dooms, NP    Allergies    Hydrocodone  Review of  Systems   Review of Systems  Constitutional:  Negative for chills and fever.  HENT:  Negative for congestion.   Respiratory:  Negative for shortness of breath.   Cardiovascular:  Negative for chest pain.  Gastrointestinal:  Negative for abdominal pain.  Genitourinary:  Negative for enuresis.  Musculoskeletal:  Negative for back pain.       Right calf pain.  Skin:  Negative for rash.  Neurological:  Negative for dizziness.  Hematological:  Does not bruise/bleed easily.   Physical Exam Updated Vital Signs BP (!) 142/81 (BP Location: Right Arm)   Pulse 89   Temp 98.5 F (36.9 C) (Oral)   Resp 16   Ht 5\' 6"  (1.676 m)   Wt 124.7 kg   LMP 04/09/2020 (Approximate) Comment: spotting  SpO2 95%   BMI 44.39 kg/m   Physical Exam Vitals and nursing note reviewed.  Constitutional:      General: She is not in acute distress.    Appearance: She is not ill-appearing.  HENT:     Head: Normocephalic and atraumatic.     Nose: No congestion.  Eyes:      Conjunctiva/sclera: Conjunctivae normal.  Cardiovascular:     Rate and Rhythm: Normal rate and regular rhythm.     Pulses: Normal pulses.     Heart sounds: No murmur heard.   No friction rub. No gallop.  Pulmonary:     Effort: No respiratory distress.     Breath sounds: No wheezing, rhonchi or rales.     Comments: Slightly tight sounding chest. Musculoskeletal:     Comments: Patient's right leg was slightly larger than the left, she had calf pain on palpitation, there is no palpable cords, no erythema, it was not warm to the touch.  She had full range of motion at her toes ankle and knee, neurovascular fully intact.  Skin:    General: Skin is warm and dry.  Neurological:     Mental Status: She is alert.  Psychiatric:        Mood and Affect: Mood normal.    ED Results / Procedures / Treatments   Labs (all labs ordered are listed, but only abnormal results are displayed) Labs Reviewed  BASIC METABOLIC PANEL - Abnormal; Notable for the following components:      Result Value   Glucose, Bld 133 (*)    All other components within normal limits  CBC WITH DIFFERENTIAL/PLATELET - Abnormal; Notable for the following components:   RBC 5.12 (*)    Hemoglobin 15.2 (*)    All other components within normal limits    EKG None  Radiology No results found.  Procedures Procedures   Medications Ordered in ED Medications  apixaban (ELIQUIS) tablet 10 mg (has no administration in time range)    Followed by  apixaban (ELIQUIS) tablet 5 mg (has no administration in time range)    ED Course  I have reviewed the triage vital signs and the nursing notes.  Pertinent labs & imaging results that were available during my care of the patient were reviewed by me and considered in my medical decision making (see chart for details).    MDM Rules/Calculators/A&P                         Initial impression-patient presents with concerns of a DVT in the right leg.She is alert, does not appear to  be in acute stress, vital signs reassuring.  Unfortunately ultrasound is unavailable this  time, she does have clinical signs concerning for DVT, will start her on apixaban obtain basic lab work-up and follow-up for tomorrow for DVT study.  Work-up-CBC unremarkable, BMP unremarkable  Reassessment-Discussed with patient concern for DVT, recommend singular dose of anticoagulant to decrease further exacerbation of clots. Risks and benefits were discussed, patient was in agreement with this plan.  Will provide patient with apixaban, have her come back tomorrow for DVT study.  Rule out-I have low suspicion for PE as patient denies pleuritic chest pain, shortness of breath,Vital signs are reassuring. I have low suspicion for septic arthritis as patient denies IV drug use, skin exam was performed no erythematous, edematous, warm joints noted on exam, no new heart murmur heard on exam.  Low suspicion for fracture or dislocation as there is no gross deformities present my exam, there is no trauma associated with this injury, will defer imaging at this time. low suspicion for ligament or tendon damage as area was palpated no gross defects noted, she has  full range of motion as well as 5/5 strength.  Low suspicion for compartment syndrome as area was palpated it was soft to the touch, neurovascular fully intact.   Plan-  Right leg swelling concern for possible DVT, will start her on apixaban, have her come back tomorrow for DVT study.  Vital signs have remained stable, no indication for hospital admission.  Patient given at home care as well strict return precautions.  Patient verbalized that they understood agreed to said plan.  Final Clinical Impression(s) / ED Diagnoses Final diagnoses:  Right leg pain    Rx / DC Orders ED Discharge Orders          Ordered    US Venous Img Lower Unilateral Right        05/02/21 1912             Aron Baba 05/02/21 Madelon Lips,  MD 05/02/21 2257

## 2021-05-02 NOTE — ED Triage Notes (Signed)
Pt was having swelling gin her right calf and swelling. Pt said she had been traveling and when she got back swelling and red. Pt was sent to ED by provider for further evaluation

## 2021-05-02 NOTE — Progress Notes (Signed)
ANTICOAGULATION CONSULT NOTE - Initial Consult  Pharmacy Consult for apixaban Indication: suspected DVT  Allergies  Allergen Reactions   Hydrocodone Nausea And Vomiting    Patient Measurements: Height: 5\' 6"  (167.6 cm) Weight: 124.7 kg (275 lb) IBW/kg (Calculated) : 59.3  Vital Signs: Temp: 98.5 F (36.9 C) (07/08 1530) Temp Source: Oral (07/08 1530) BP: 142/81 (07/08 1530) Pulse Rate: 89 (07/08 1530)  Labs: Recent Labs    05/02/21 1750  HGB 15.2*  HCT 45.9  PLT 298  CREATININE 0.54    Estimated Creatinine Clearance: 107.2 mL/min (by C-G formula based on SCr of 0.54 mg/dL).   Medical History: Past Medical History:  Diagnosis Date   Asthma    Chronic back pain    Diabetes mellitus without complication ([facility])    Changed diet and A1C normal   GERD (gastroesophageal reflux disease)    Sciatica     Medications:  Scheduled:   apixaban  10 mg Oral BID   Followed by   Derrill Memo ON 05/09/2021] apixaban  5 mg Oral BID    Assessment: Patients presented to the ER with right calf pain and swelling. Unable to obtain confirmation until AM so will treat for presumed DVT.  Goal of Therapy:   Monitor platelets by anticoagulation protocol: Yes   Plan:  Apixaban 10mg  BID x7 days followed by 5mg  BID  Lolita Rieger 05/02/2021,6:47 PM

## 2021-05-02 NOTE — Discharge Instructions (Addendum)
I am concerned that you might have a blood clot in your right leg, have given you a singular dose of a blood thinner.  This is to help prevent future clots from forming.  This does put you at increased risk for bleeding, if you fall, hit your head, or have any severe trauma you have to come back here for further evaluation.  I like to come back tomorrow for a DVT study, I have given the contact information above please call the number to schedule your appointment.  Come back to the emergency department if you develop chest pain, shortness of breath, severe abdominal pain, uncontrolled nausea, vomiting, diarrhea.

## 2021-05-02 NOTE — ED Triage Notes (Signed)
Pt has swelling and pain to right calf. States she has been traveling and this started after she traveled home from the beach Sunday. Pt sent over from [facility]

## 2021-05-03 ENCOUNTER — Telehealth (HOSPITAL_COMMUNITY): Payer: Self-pay | Admitting: Emergency Medicine

## 2021-05-03 ENCOUNTER — Ambulatory Visit (HOSPITAL_COMMUNITY)
Admission: RE | Admit: 2021-05-03 | Discharge: 2021-05-03 | Disposition: A | Discharge status: Home / Self Care | Payer: BC Managed Care – PPO | Source: Ambulatory Visit | Attending: Student | Admitting: Student

## 2021-05-03 DIAGNOSIS — I82441 Acute embolism and thrombosis of right tibial vein: Secondary | ICD-10-CM | POA: Diagnosis not present

## 2021-05-03 DIAGNOSIS — I82451 Acute embolism and thrombosis of right peroneal vein: Secondary | ICD-10-CM

## 2021-05-03 DIAGNOSIS — M79604 Pain in right leg: Secondary | ICD-10-CM | POA: Diagnosis not present

## 2021-05-03 DIAGNOSIS — M7989 Other specified soft tissue disorders: Secondary | ICD-10-CM | POA: Insufficient documentation

## 2021-05-03 MED ORDER — APIXABAN (ELIQUIS) VTE STARTER PACK (10MG AND 5MG): ORAL_TABLET | ORAL | 0 refills | Status: DC

## 2021-05-03 NOTE — Telephone Encounter (Signed)
55 year old lady seen yesterday with concern for right lower leg pain after recent road trip.  Given dose of apixaban in ER.  Came back today for DVT study.  Ultrasound did demonstrate short segment DVT involving right posterior tibial and peroneal veins.  Discussed findings with patient.  Reexamined leg, appears neurovascular intact.  Normal DP/PT pulses.  After further questioning with patient, do not see contraindication to starting anticoagulation.  No recent surgeries, no head trauma, no history of brain bleed, no GI bleed history.  Discussed risk and benefits of anticoagulation with patient.  She is interested in pursuing further.  Recommend she follow-up closely with her primary doctor to further discuss longer-term management.  Will prescribe apixaban.  Counseled on initiation of blood thinners.  Reviewed return precautions.

## 2021-05-26 DIAGNOSIS — Z6841 Body Mass Index (BMI) 40.0 and over, adult: Secondary | ICD-10-CM | POA: Diagnosis not present

## 2021-05-26 DIAGNOSIS — Z719 Counseling, unspecified: Secondary | ICD-10-CM | POA: Diagnosis not present

## 2021-05-26 DIAGNOSIS — I82401 Acute embolism and thrombosis of unspecified deep veins of right lower extremity: Secondary | ICD-10-CM | POA: Diagnosis not present

## 2021-05-26 DIAGNOSIS — E1165 Type 2 diabetes mellitus with hyperglycemia: Secondary | ICD-10-CM | POA: Diagnosis not present

## 2021-05-26 DIAGNOSIS — F17200 Nicotine dependence, unspecified, uncomplicated: Secondary | ICD-10-CM | POA: Diagnosis not present

## 2021-05-26 DIAGNOSIS — Z1331 Encounter for screening for depression: Secondary | ICD-10-CM | POA: Diagnosis not present

## 2021-05-27 DIAGNOSIS — E1165 Type 2 diabetes mellitus with hyperglycemia: Secondary | ICD-10-CM | POA: Diagnosis not present

## 2021-10-17 IMAGING — US US EXTREM LOW VENOUS*R*
1 series · 13 of 24 positions shown · non-contrast
Comparison: Right lower extremity venous Doppler
ultrasound-05/17/2019 (positive for superficial thrombophlebitis
involving a calf varicosity)

CLINICAL DATA: Right lower extremity pain and edema for the past 10
days. History previous greater saphenous vein ablation for
varicosities greater than 20 years ago. Evaluate for DVT.



[Series 1: us venous img lower uni right (dvt) · portal-venous · 13 of 49 slices shown]
[im 1/49]
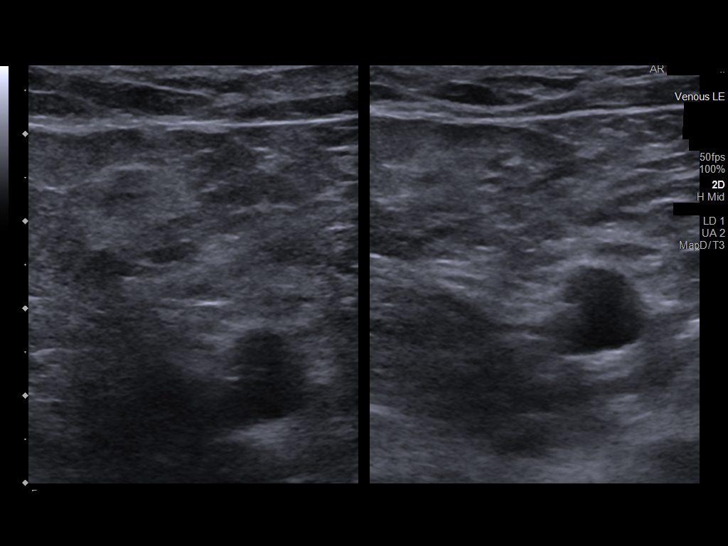
[im 5/49]
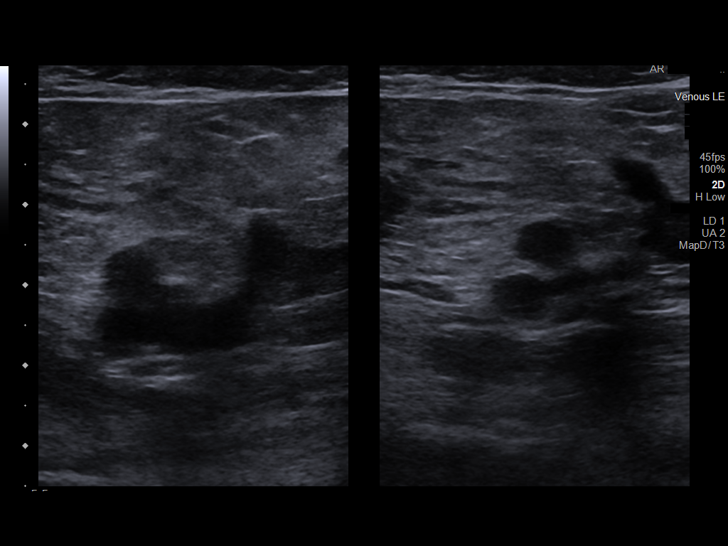
[im 9/49]
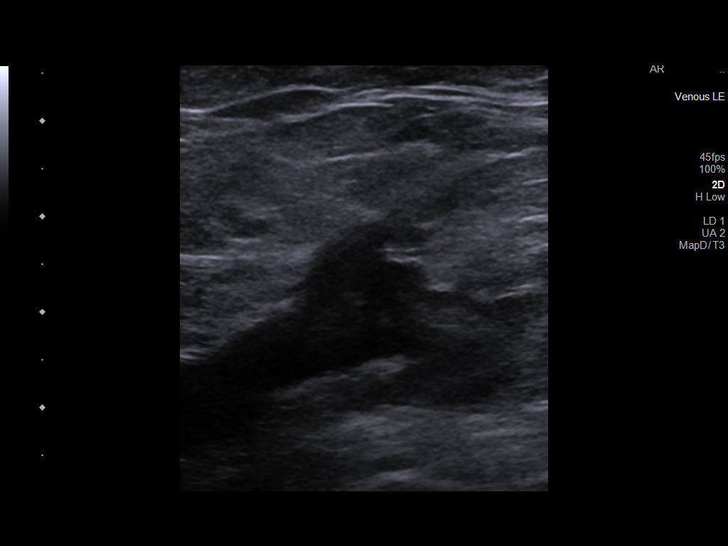
[im 13/49]
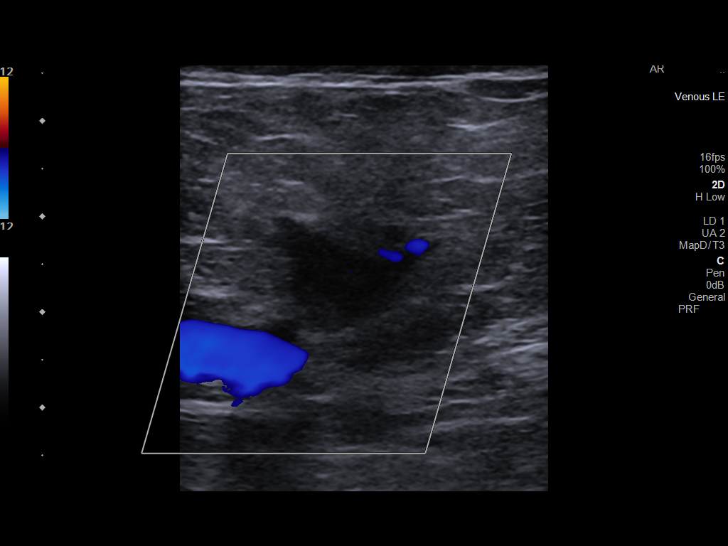
[im 17/49]
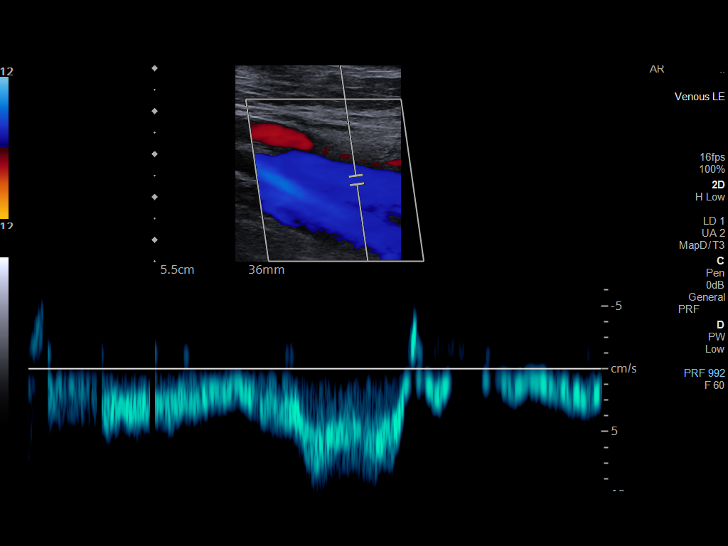
[im 21/49]
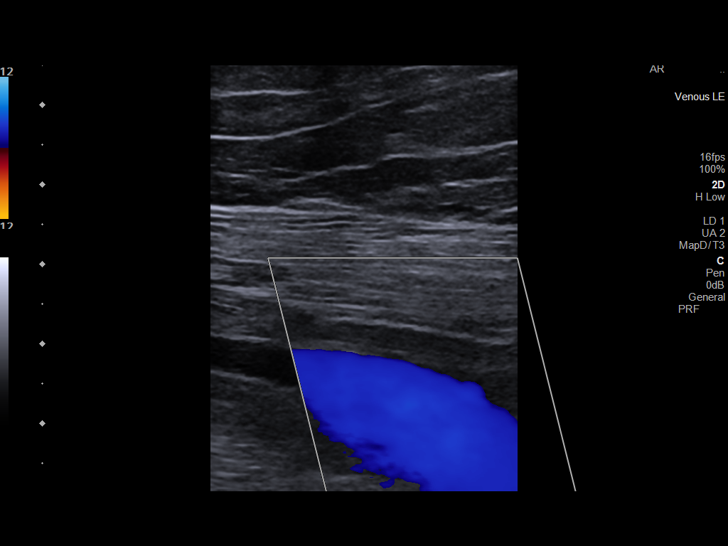
[im 26/49]
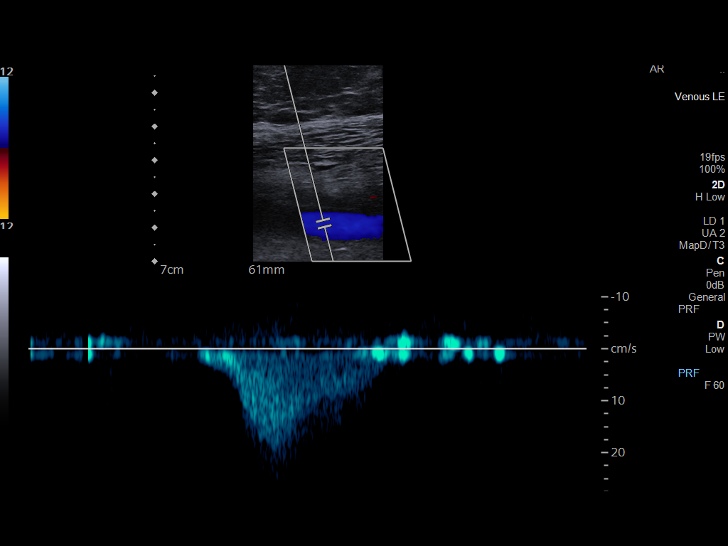
[im 28/49]
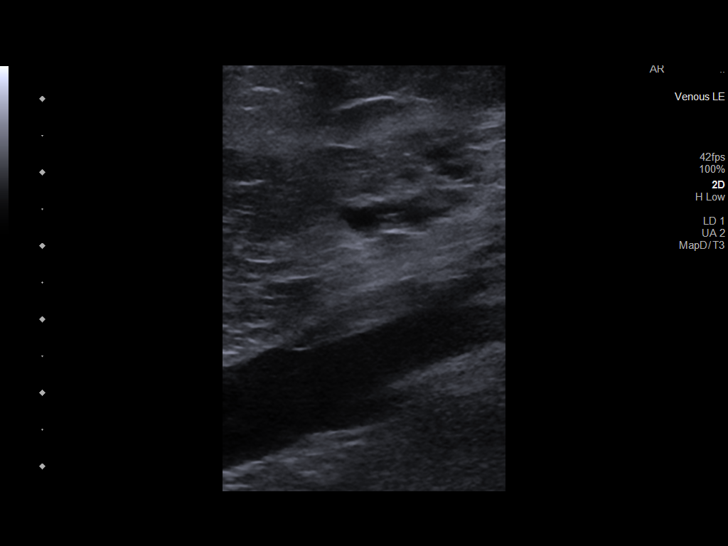
[im 32/49]
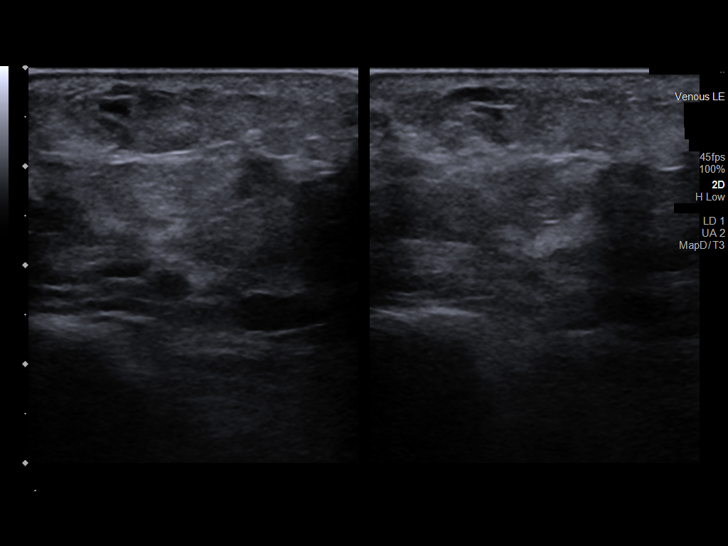
[im 36/49]
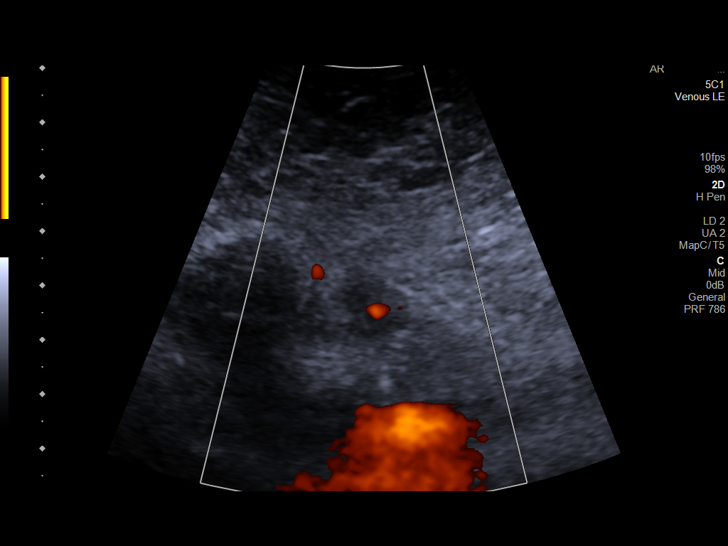
[im 40/49]
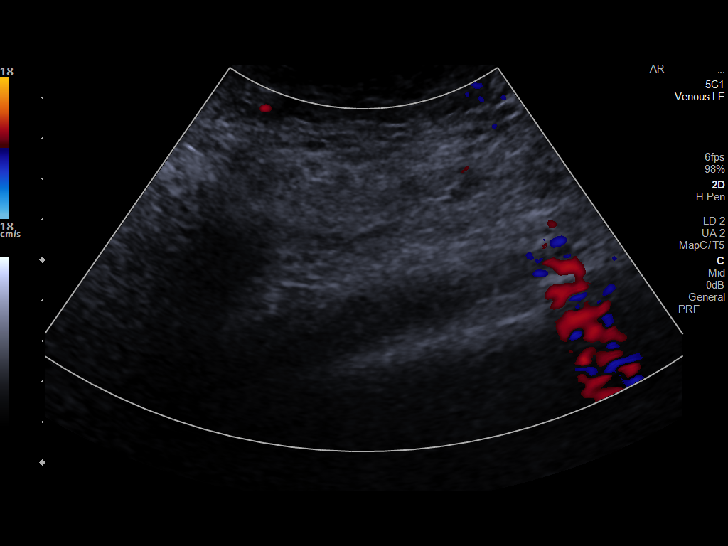
[im 44/49]
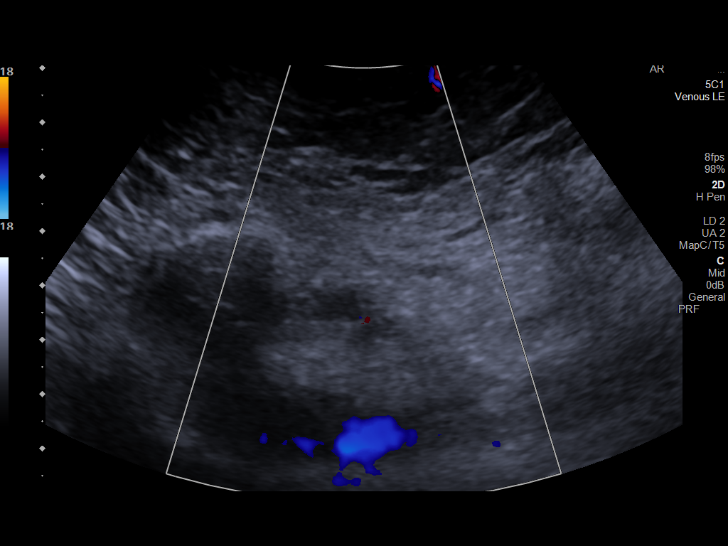
[im 49/49]
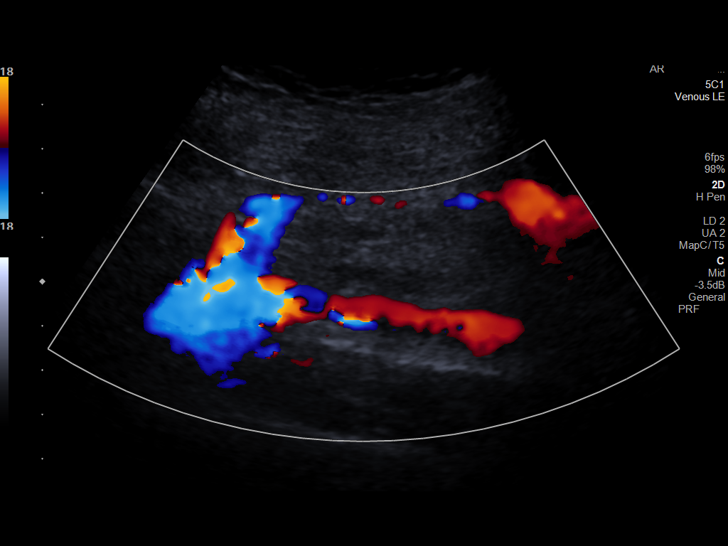

[13 of 24 positions shown; findings below may reference images not displayed]

FINDINGS: Contralateral Common Femoral Vein: Respiratory phasicity is normal
and symmetric with the symptomatic side. No evidence of thrombus.
Normal compressibility.

Common Femoral Vein: No evidence of thrombus. Normal
compressibility, respiratory phasicity and response to augmentation.

Saphenofemoral Junction: No evidence of thrombus. Normal
compressibility and flow on color Doppler imaging.

Profunda Femoral Vein: No evidence of thrombus. Normal
compressibility and flow on color Doppler imaging.

Femoral Vein: No evidence of thrombus. Normal compressibility,
respiratory phasicity and response to augmentation.

Popliteal Vein: No evidence of thrombus. Normal compressibility,
respiratory phasicity and response to augmentation.

Calf Veins: There is apparent short-segment hypoechoic occlusive
thrombus involving the right posterior tibial (images 39 and 40 and
peroneal (image 47) veins.

Superficial Great Saphenous Vein: No evidence of thrombus. Normal
compressibility.

Other Findings: Previously noted thrombosed calf varicosity or not
imaged on the present examination.
IMPRESSION: Examination positive for occlusive short-segment DVT involving the
right posterior tibial and peroneal veins. There is no extension of
this occlusive distal tibial DVT to the more proximal venous system
of the right lower extremity.

## 2021-11-19 ENCOUNTER — Other Ambulatory Visit: Payer: Self-pay | Admitting: Family Medicine

## 2021-11-19 ENCOUNTER — Other Ambulatory Visit (HOSPITAL_COMMUNITY): Payer: Self-pay | Admitting: Family Medicine

## 2021-11-19 DIAGNOSIS — E1165 Type 2 diabetes mellitus with hyperglycemia: Secondary | ICD-10-CM | POA: Diagnosis not present

## 2021-11-19 DIAGNOSIS — Z6841 Body Mass Index (BMI) 40.0 and over, adult: Secondary | ICD-10-CM | POA: Diagnosis not present

## 2021-11-19 DIAGNOSIS — K219 Gastro-esophageal reflux disease without esophagitis: Secondary | ICD-10-CM | POA: Diagnosis not present

## 2021-11-19 DIAGNOSIS — F17200 Nicotine dependence, unspecified, uncomplicated: Secondary | ICD-10-CM | POA: Diagnosis not present

## 2021-11-19 DIAGNOSIS — Z0000 Encounter for general adult medical examination without abnormal findings: Secondary | ICD-10-CM | POA: Diagnosis not present

## 2021-11-19 DIAGNOSIS — I82401 Acute embolism and thrombosis of unspecified deep veins of right lower extremity: Secondary | ICD-10-CM | POA: Diagnosis not present

## 2021-11-19 DIAGNOSIS — M79604 Pain in right leg: Secondary | ICD-10-CM

## 2021-11-24 ENCOUNTER — Ambulatory Visit (HOSPITAL_COMMUNITY)
Admission: RE | Admit: 2021-11-24 | Discharge: 2021-11-24 | Disposition: A | Discharge status: Home / Self Care | Payer: BC Managed Care – PPO | Source: Ambulatory Visit | Attending: Family Medicine | Admitting: Family Medicine

## 2021-11-24 ENCOUNTER — Other Ambulatory Visit: Payer: Self-pay

## 2021-11-24 DIAGNOSIS — M79604 Pain in right leg: Secondary | ICD-10-CM | POA: Diagnosis not present

## 2022-02-22 DIAGNOSIS — R739 Hyperglycemia, unspecified: Secondary | ICD-10-CM | POA: Diagnosis not present

## 2022-02-22 DIAGNOSIS — R9431 Abnormal electrocardiogram [ECG] [EKG]: Secondary | ICD-10-CM | POA: Diagnosis not present

## 2022-02-22 DIAGNOSIS — R0602 Shortness of breath: Secondary | ICD-10-CM | POA: Diagnosis not present

## 2022-02-22 DIAGNOSIS — R002 Palpitations: Secondary | ICD-10-CM | POA: Diagnosis not present

## 2022-03-04 DIAGNOSIS — E1165 Type 2 diabetes mellitus with hyperglycemia: Secondary | ICD-10-CM | POA: Diagnosis not present

## 2022-03-04 DIAGNOSIS — F17200 Nicotine dependence, unspecified, uncomplicated: Secondary | ICD-10-CM | POA: Diagnosis not present

## 2022-03-04 DIAGNOSIS — Z6841 Body Mass Index (BMI) 40.0 and over, adult: Secondary | ICD-10-CM | POA: Diagnosis not present

## 2022-03-04 DIAGNOSIS — R55 Syncope and collapse: Secondary | ICD-10-CM | POA: Diagnosis not present

## 2022-04-14 ENCOUNTER — Other Ambulatory Visit (HOSPITAL_COMMUNITY): Payer: Self-pay | Admitting: Adult Health

## 2022-04-14 ENCOUNTER — Encounter: Payer: Self-pay | Admitting: Adult Health

## 2022-04-14 ENCOUNTER — Ambulatory Visit: Payer: BC Managed Care – PPO | Admitting: Adult Health

## 2022-04-14 VITALS — BP 153/78 | HR 85 | Ht 66.0 in | Wt 281.2 lb

## 2022-04-14 DIAGNOSIS — Z1231 Encounter for screening mammogram for malignant neoplasm of breast: Secondary | ICD-10-CM

## 2022-04-14 DIAGNOSIS — R102 Pelvic and perineal pain: Secondary | ICD-10-CM | POA: Diagnosis not present

## 2022-04-14 DIAGNOSIS — N950 Postmenopausal bleeding: Secondary | ICD-10-CM

## 2022-04-14 NOTE — Progress Notes (Signed)
  Subjective:     Patient ID: Jessica [name], female   DOB: 12-21-1965, 56 y.o.   MRN: [id number]  HPI Jessica [name] is a 56 year old white female, married, PM in complaining of vaginal bleeding, pink in color and pain in ovary area like when getting period. LMP 2021.   Lab Results  Component Value Date   DIAGPAP  09/11/2020    - Negative for intraepithelial lesion or malignancy (NILM)   [place] Negative 09/11/2020   PCP is Dr Hilma Favors.  Review of Systems Has had vaginal bleeding, for 1 week,pink Has pain on ovary area, like when getting period   Reviewed past medical,surgical, social and family history. Reviewed medications and allergies.  Objective:   Physical Exam BP (!) 153/78 (BP Location: Right Arm, Patient Position: Sitting, Cuff Size: Normal)   Pulse 85   Ht '5\' 6"'$  (1.676 m)   Wt 281 lb 3.2 oz (127.6 kg)   LMP 04/09/2020 (Approximate) Comment: spotting  BMI 45.39 kg/m  Skin warm and dry.Pelvic: external genitalia is normal in appearance no lesions, vagina:brown blood in vault,urethra has no lesions or masses noted, cervix bulbous, uterus: normal size, shape and contour, non tender, no masses felt, adnexa: no masses or tenderness noted. Bladder is non tender and no masses felt.     [place] - 04/14/22 1424       Pregnancy Intention Screening   Does the patient want to become pregnant in the next year? No    Does the patient's partner want to become pregnant in the next year? No    Would the patient like to discuss contraceptive options today? No      Contraception Wrap Up   Current Method --   PM   End Method --   PM   Contraception Counseling Provided No            Examination chaperoned by Celene Squibb LPN  Assessment:     1. PMB (postmenopausal bleeding) Has had spotting for about a week is light pink Pelvic US scheduled for her 04/22/22 at [facility] at 7:30 am to assess uterine lining - US PELVIC COMPLETE WITH TRANSVAGINAL; Future  2. Pelvic pain Has some pain in  ovary area,like when getting period Will get pelvic US 04/22/22 at [facility] to assess uterus and ovaries  - US PELVIC COMPLETE WITH TRANSVAGINAL; Future     Plan:    Will get screening mammogram 04/22/22 at [facility] at 9:45 am as she needs one too.  Will talk when [place] back Follow up TBD

## 2022-04-22 ENCOUNTER — Ambulatory Visit (HOSPITAL_COMMUNITY)
Admission: RE | Admit: 2022-04-22 | Discharge: 2022-04-22 | Disposition: A | Discharge status: Home / Self Care | Payer: BC Managed Care – PPO | Source: Ambulatory Visit | Attending: Adult Health | Admitting: Adult Health

## 2022-04-22 DIAGNOSIS — N888 Other specified noninflammatory disorders of cervix uteri: Secondary | ICD-10-CM | POA: Diagnosis not present

## 2022-04-22 DIAGNOSIS — N950 Postmenopausal bleeding: Secondary | ICD-10-CM | POA: Insufficient documentation

## 2022-04-22 DIAGNOSIS — Z1231 Encounter for screening mammogram for malignant neoplasm of breast: Secondary | ICD-10-CM

## 2022-04-22 DIAGNOSIS — R102 Pelvic and perineal pain: Secondary | ICD-10-CM | POA: Diagnosis not present

## 2022-04-22 DIAGNOSIS — R9389 Abnormal findings on diagnostic imaging of other specified body structures: Secondary | ICD-10-CM | POA: Diagnosis not present

## 2022-04-23 ENCOUNTER — Telehealth: Payer: Self-pay | Admitting: Adult Health

## 2022-04-23 ENCOUNTER — Telehealth: Payer: Self-pay

## 2022-04-23 DIAGNOSIS — N950 Postmenopausal bleeding: Secondary | ICD-10-CM

## 2022-04-23 NOTE — Telephone Encounter (Signed)
Pt called and stated that she was returning a phone call from [facility].

## 2022-04-23 NOTE — Telephone Encounter (Signed)
Left message to call me about US done yesterday, the endometrium is thickened, needs endometrial biopsy.

## 2022-04-23 NOTE — Telephone Encounter (Signed)
Pt aware  she needs endometrial biopsy will call in am for appt

## 2022-05-13 ENCOUNTER — Other Ambulatory Visit (HOSPITAL_COMMUNITY)
Admission: RE | Admit: 2022-05-13 | Discharge: 2022-05-13 | Disposition: A | Discharge status: Home / Self Care | Payer: BC Managed Care – PPO | Source: Ambulatory Visit | Attending: Obstetrics & Gynecology | Admitting: Obstetrics & Gynecology

## 2022-05-13 ENCOUNTER — Encounter: Payer: Self-pay | Admitting: Obstetrics & Gynecology

## 2022-05-13 ENCOUNTER — Ambulatory Visit: Payer: BC Managed Care – PPO | Admitting: Obstetrics & Gynecology

## 2022-05-13 VITALS — BP 161/82 | HR 90 | Ht 66.0 in | Wt 274.6 lb

## 2022-05-13 DIAGNOSIS — N950 Postmenopausal bleeding: Secondary | ICD-10-CM | POA: Diagnosis not present

## 2022-05-13 DIAGNOSIS — Z6841 Body Mass Index (BMI) 40.0 and over, adult: Secondary | ICD-10-CM

## 2022-05-13 DIAGNOSIS — N8501 Benign endometrial hyperplasia: Secondary | ICD-10-CM | POA: Diagnosis not present

## 2022-05-13 NOTE — Progress Notes (Signed)
   GYN VISIT Patient name: Jessica [name] MRN [id number]  Date of birth: 10-Jun-1966 Chief Complaint:   Postmenopausal bleeding  History of Present Illness:   Jessica [name] is a 56 y.o. [phone] female being seen today for PMB  She was seen by Electa Sniff on 6/20 noted pink vaginal bleeding and "ovarian pain" like she was having her period.  Event was about 1-2 mos ago, bleeding was very light- did not even require a pad.  Only noticeable on toilet paper.  Denies vaginal discharge, itching or irritation.  No other acute complaints  [place] completed-04/22/22: 8.4cm uterus, 26m endometrium.     Patient's last menstrual period was 04/09/2020 (approximate).     09/11/2020   11:00 AM  Depression screen PHQ 2/9  Decreased Interest 0  Down, Depressed, Hopeless 0  PHQ - 2 Score 0     Review of Systems:   Pertinent items are noted in HPI Denies fever/chills, dizziness, headaches, visual disturbances, fatigue, shortness of breath, chest pain, abdominal pain, vomiting, denies problems with bowel movements, urination, or intercourse unless otherwise stated above.  Pertinent History Reviewed:  Reviewed past medical,surgical, social, obstetrical and family history.  Reviewed problem list, medications and allergies. Physical Assessment:   Vitals:   05/13/22 1159  BP: (!) 161/82  Pulse: 90  Weight: 274 lb 9.6 oz (124.6 kg)  Height: '5\' 6"'$  (1.676 m)  Body mass index is 44.32 kg/m.       Physical Examination:   General appearance: alert, well appearing, and in no distress  Psych: mood appropriate, normal affect  Skin: warm & dry   Cardiovascular: normal heart rate noted  Respiratory: normal respiratory effort, no distress  Abdomen: soft, non-tender   Pelvic: VULVA: normal appearing vulva with no masses, tenderness or lesions, VAGINA: normal appearing vagina with normal color and discharge, no lesions, CERVIX: normal appearing cervix without discharge or lesions, UTERUS: uterus is normal size,  shape, consistency and nontender, no adnexal masses or tenderness appreciated- exam limited due to body habitus  Extremities: no edema   Chaperone: ACelene Squibb   Endometrial Biopsy Procedure Note  Pre-operative Diagnosis: postmenopausal bleeding  Post-operative Diagnosis: same  Procedure Details  The risks (including infection, bleeding, pain, and uterine perforation) and benefits of the procedure were explained to the patient and Written informed consent was obtained.  Antibiotic prophylaxis against endocarditis was not indicated.   The patient was placed in the dorsal lithotomy position.  Bimanual exam showed the uterus to be in the neutral position.  A speculum inserted in the vagina, and the cervix prepped with betadine.     A single tooth tenaculum was applied to the anterior lip of the cervix for stabilization.   A Pipelle endometrial aspirator was used to sample the endometrium.  Sample was sent for pathologic examination.  Condition: Stable  Complications: None   Assessment & Plan:  1) Postmenopausal bleeding Next step pending results of pathology.   The patient was advised to call for any fever or for prolonged or severe pain or bleeding. She was advised to use OTC analgesics as needed for mild to moderate pain. She was advised to avoid vaginal intercourse for 48 hours or until the bleeding has completely stopped.   If negative, follow up in Nov for annual   Return for NOv 2023 annual.   JJanyth Pupa DO Attending [place] [facility]

## 2022-05-14 ENCOUNTER — Telehealth: Payer: Self-pay | Admitting: Obstetrics & Gynecology

## 2022-05-14 DIAGNOSIS — N8500 Endometrial hyperplasia, unspecified: Secondary | ICD-10-CM

## 2022-05-14 LAB — SURGICAL PATHOLOGY

## 2022-05-14 MED ORDER — NORETHINDRONE ACETATE 5 MG PO TABS: 5.0000 mg | ORAL_TABLET | Freq: Every day | ORAL | 4 refills | Status: DC

## 2022-05-14 NOTE — Telephone Encounter (Signed)
Called patient with results of endometrial biopsy.  Negative for cancer, but hyperplasia noted. Plan to start on progesterone therapy and will need repeat EMB in 3-6 mos. Questions and concerns were addressed.  Janyth Pupa, DO Attending [place], [facility] for [employer], [place]

## 2022-08-19 ENCOUNTER — Ambulatory Visit: Payer: BC Managed Care – PPO | Admitting: Obstetrics & Gynecology

## 2022-11-16 ENCOUNTER — Emergency Department (HOSPITAL_COMMUNITY)
Admission: EM | Admit: 2022-11-16 | Discharge: 2022-11-17 | Disposition: A | Discharge status: Home / Self Care | Payer: BC Managed Care – PPO | Attending: Emergency Medicine | Admitting: Emergency Medicine

## 2022-11-16 ENCOUNTER — Other Ambulatory Visit: Payer: Self-pay

## 2022-11-16 ENCOUNTER — Emergency Department (HOSPITAL_COMMUNITY): Payer: BC Managed Care – PPO

## 2022-11-16 ENCOUNTER — Encounter (HOSPITAL_COMMUNITY): Payer: Self-pay | Admitting: Emergency Medicine

## 2022-11-16 DIAGNOSIS — D72829 Elevated white blood cell count, unspecified: Secondary | ICD-10-CM | POA: Insufficient documentation

## 2022-11-16 DIAGNOSIS — R21 Rash and other nonspecific skin eruption: Secondary | ICD-10-CM | POA: Insufficient documentation

## 2022-11-16 DIAGNOSIS — T7840XA Allergy, unspecified, initial encounter: Secondary | ICD-10-CM

## 2022-11-16 DIAGNOSIS — T360X5A Adverse effect of penicillins, initial encounter: Secondary | ICD-10-CM | POA: Diagnosis not present

## 2022-11-16 DIAGNOSIS — E119 Type 2 diabetes mellitus without complications: Secondary | ICD-10-CM | POA: Insufficient documentation

## 2022-11-16 DIAGNOSIS — R0789 Other chest pain: Secondary | ICD-10-CM | POA: Diagnosis not present

## 2022-11-16 DIAGNOSIS — J45909 Unspecified asthma, uncomplicated: Secondary | ICD-10-CM | POA: Insufficient documentation

## 2022-11-16 LAB — CBC WITH DIFFERENTIAL/PLATELET
Abs Immature Granulocytes: 0.07 10*3/uL (ref 0.00–0.07)
Basophils Absolute: 0.1 10*3/uL (ref 0.0–0.1)
Basophils Relative: 1 %
Eosinophils Absolute: 0.2 10*3/uL (ref 0.0–0.5)
Eosinophils Relative: 2 %
HCT: 47.5 % — ABNORMAL HIGH (ref 36.0–46.0)
Hemoglobin: 15.7 g/dL — ABNORMAL HIGH (ref 12.0–15.0)
Immature Granulocytes: 1 %
Lymphocytes Relative: 30 %
Lymphs Abs: 4 10*3/uL (ref 0.7–4.0)
MCH: 29.2 pg (ref 26.0–34.0)
MCHC: 33.1 g/dL (ref 30.0–36.0)
MCV: 88.3 fL (ref 80.0–100.0)
Monocytes Absolute: 1 10*3/uL (ref 0.1–1.0)
Monocytes Relative: 7 %
Neutro Abs: 8.1 10*3/uL — ABNORMAL HIGH (ref 1.7–7.7)
Neutrophils Relative %: 59 %
Platelets: 268 10*3/uL (ref 150–400)
RBC: 5.38 MIL/uL — ABNORMAL HIGH (ref 3.87–5.11)
RDW: 14 % (ref 11.5–15.5)
WBC: 13.4 10*3/uL — ABNORMAL HIGH (ref 4.0–10.5)
nRBC: 0 % (ref 0.0–0.2)

## 2022-11-16 LAB — BASIC METABOLIC PANEL
Anion gap: 7 (ref 5–15)
BUN: 21 mg/dL — ABNORMAL HIGH (ref 6–20)
CO2: 28 mmol/L (ref 22–32)
Calcium: 8.8 mg/dL — ABNORMAL LOW (ref 8.9–10.3)
Chloride: 104 mmol/L (ref 98–111)
Creatinine, Ser: 0.95 mg/dL (ref 0.44–1.00)
GFR, Estimated: >60 mL/min (ref >60)
Glucose, Bld: 175 mg/dL — ABNORMAL HIGH (ref 70–99)
Potassium: 4.3 mmol/L (ref 3.5–5.1)
Sodium: 139 mmol/L (ref 135–145)

## 2022-11-16 MED ORDER — METHYLPREDNISOLONE SODIUM SUCC 125 MG IJ SOLR
125.0000 mg | Freq: Once | INTRAMUSCULAR | Status: AC
  Administered 2022-11-16: 125 mg via INTRAVENOUS
  Filled 2022-11-16: qty 2

## 2022-11-16 MED ORDER — DIPHENHYDRAMINE HCL 50 MG/ML IJ SOLN
25.0000 mg | Freq: Once | INTRAMUSCULAR | Status: AC
  Administered 2022-11-16: 25 mg via INTRAVENOUS
  Filled 2022-11-16: qty 1

## 2022-11-16 MED ORDER — LACTATED RINGERS IV BOLUS
1000.0000 mL | Freq: Once | INTRAVENOUS | Status: AC
  Administered 2022-11-16: 1000 mL via INTRAVENOUS

## 2022-11-16 MED ORDER — FAMOTIDINE IN NACL 20-0.9 MG/50ML-% IV SOLN
20.0000 mg | Freq: Once | INTRAVENOUS | Status: AC
  Administered 2022-11-16: 20 mg via INTRAVENOUS
  Filled 2022-11-16: qty 50

## 2022-11-16 NOTE — ED Provider Notes (Signed)
[facility] Provider Note   CSN: [id number] Arrival date & time: 11/16/22  2036     History  Chief Complaint  Patient presents with   Allergic Reaction    Jessica [name] is a 57 y.o. female.   Allergic Reaction Presenting symptoms: rash   Patient presents for suspected allergic reaction.  Approximately 1 hour prior to arrival, she took a dose of amoxicillin.  She is taking this for treatment of a area of cellulitis under her left breast.  This was her first dose of this course.  She states that she has never had any allergic reactions to antibiotics in the past.  Shortly after taking her dose, patient experienced a diffuse burning rash.  She also experienced chest tightness.  She took 25 mg of Benadryl.  She initially had nausea which has resolved.  She denies any sensation of throat swelling or tongue swelling.  Since taking Benadryl, symptoms have mildly improved.     Home Medications Prior to Admission medications   Medication Sig Start Date End Date Taking? Authorizing Provider  diphenhydrAMINE (BENADRYL) 25 MG tablet Take 1 tablet (25 mg total) by mouth every 6 (six) hours for 3 days. 11/17/22 11/20/22 Yes Godfrey Pick, MD  famotidine (PEPCID) 20 MG tablet Take 1 tablet (20 mg total) by mouth 2 (two) times daily for 3 days. 11/17/22 11/20/22 Yes Godfrey Pick, MD  predniSONE (DELTASONE) 10 MG tablet Take 4 tablets (40 mg total) by mouth daily for 3 days. 11/17/22 11/20/22 Yes Godfrey Pick, MD  sulfamethoxazole-trimethoprim (BACTRIM DS) 800-160 MG tablet Take 1 tablet by mouth 2 (two) times daily for 7 days. 11/17/22 11/24/22 Yes Godfrey Pick, MD  levothyroxine (SYNTHROID) 50 MCG tablet Take 50 mcg by mouth daily before breakfast. Patient not taking: Reported on 04/14/2022    [provider]  norethindrone (AYGESTIN) 5 MG tablet Take 1 tablet (5 mg total) by mouth daily. 05/14/22 08/12/22  Janyth Pupa, DO      Allergies    Hydrocodone     Review of Systems   Review of Systems  Respiratory:  Positive for chest tightness.   Gastrointestinal:  Positive for nausea.  Skin:  Positive for rash.  All other systems reviewed and are negative.   Physical Exam Updated Vital Signs BP 116/67   Pulse 70   Temp (!) 96 F (35.6 C) (Axillary)   Resp 15   Ht '5\' 6"'$  (1.676 m)   Wt 113.9 kg   LMP 04/09/2020 (Approximate) Comment: spotting  SpO2 100%   BMI 40.51 kg/m  Physical Exam Vitals and nursing note reviewed.  Constitutional:      General: She is not in acute distress.    Appearance: Normal appearance. She is well-developed. She is not ill-appearing, toxic-appearing or diaphoretic.  HENT:     Head: Normocephalic and atraumatic.     Right Ear: External ear normal.     Left Ear: External ear normal.     Nose: Nose normal.     Mouth/Throat:     Mouth: Mucous membranes are moist.  Eyes:     Extraocular Movements: Extraocular movements intact.     Conjunctiva/sclera: Conjunctivae normal.  Cardiovascular:     Rate and Rhythm: Normal rate and regular rhythm.     Heart sounds: No murmur heard. Pulmonary:     Effort: Pulmonary effort is normal. No respiratory distress.     Breath sounds: Normal breath sounds. No wheezing or rhonchi.  Abdominal:  General: There is no distension.     Palpations: Abdomen is soft.     Tenderness: There is no abdominal tenderness.  Musculoskeletal:        General: No swelling. Normal range of motion.     Cervical back: Normal range of motion and neck supple.     Right lower leg: No edema.     Left lower leg: No edema.  Skin:    General: Skin is warm and dry.     Capillary Refill: Capillary refill takes less than 2 seconds.     Findings: Rash present.  Neurological:     General: No focal deficit present.     Mental Status: She is alert and oriented to person, place, and time.     Cranial Nerves: No cranial nerve deficit.     Sensory: No sensory deficit.     Motor: No weakness.      Coordination: Coordination normal.  Psychiatric:        Mood and Affect: Mood normal.        Behavior: Behavior normal.        Thought Content: Thought content normal.        Judgment: Judgment normal.     ED Results / Procedures / Treatments   Labs (all labs ordered are listed, but only abnormal results are displayed) Labs Reviewed  CBC WITH DIFFERENTIAL/PLATELET - Abnormal; Notable for the following components:      Result Value   WBC 13.4 (*)    RBC 5.38 (*)    Hemoglobin 15.7 (*)    HCT 47.5 (*)    Neutro Abs 8.1 (*)    All other components within normal limits  BASIC METABOLIC PANEL - Abnormal; Notable for the following components:   Glucose, Bld 175 (*)    BUN 21 (*)    Calcium 8.8 (*)    All other components within normal limits  I-STAT CHEM 8, ED    EKG EKG Interpretation  Date/Time:  Monday November 16 2022 21:05:32 EST Ventricular Rate:  87 PR Interval:    QRS Duration: 119 QT Interval:  385 QTC Calculation: 464 R Axis:   6 Text Interpretation: Sinus rhythm Nonspecific intraventricular conduction delay Inferior infarct, old Confirmed by Godfrey Pick [phone]) on 11/16/2022 11:09:36 PM  Radiology No results found.  Procedures Procedures    Medications Ordered in ED Medications  methylPREDNISolone sodium succinate (SOLU-MEDROL) 125 mg/2 mL injection 125 mg (125 mg Intravenous Given 11/16/22 2152)  famotidine (PEPCID) IVPB 20 mg premix (0 mg Intravenous Stopped 11/16/22 2222)  diphenhydrAMINE (BENADRYL) injection 25 mg (25 mg Intravenous Given 11/16/22 2152)  lactated ringers bolus 1,000 mL (1,000 mLs Intravenous New Bag/Given 11/16/22 2151)    ED Course/ Medical Decision Making/ A&P                             Medical Decision Making Amount and/or Complexity of Data Reviewed Labs: ordered.  Risk Prescription drug management.   This patient presents to the ED for concern of rash and chest tightness, this involves an extensive number of treatment  options, and is a complaint that carries with it a high risk of complications and morbidity.  The differential diagnosis includes allergic reaction, anxiety, environmental exposure, serum sickness.   Co morbidities that complicate the patient evaluation  Asthma, DM, GERD, chronic back pain   Additional history obtained:  Additional history obtained from patient's daughter External records from outside source  obtained and reviewed including EMR   Lab Tests:  I Ordered, and personally interpreted labs.  The pertinent results include: Leukocytosis is present.  Glucose is mildly elevated.  Electrolytes are normal.  Cardiac Monitoring: / EKG:  The patient was maintained on a cardiac monitor.  I personally viewed and interpreted the cardiac monitored which showed an underlying rhythm of: Sinus rhythm   Problem List / ED Course / Critical interventions / Medication management  Patient presenting for symptoms of chest tightness, nausea, and diffuse burning/pruritic rash.  Onset of symptoms was shortly after she took a dose of amoxicillin.  She has no known history of allergies to any antibiotics.  She did take Benadryl prior to arrival.  On arrival, patient is well-appearing.  Her breathing is unlabored.  No wheezing is appreciated on lung auscultation, however, she does endorse ongoing chest tightness.  She denies any sensation of throat tightness.  Oropharynx is patent on direct examination.  On her skin, she does have a blanchable pruritic rash.  Patient is kept on bedside cardiac monitor.  She was treated with Solu-Medrol, Benadryl, Pepcid, and IV fluids.  Lab work is notable for leukocytosis.  On reassessment, patient has resolution of all symptoms.  She was monitored in the ED for 3 hours.  She does feel comfortable with discharge home.  Patient to be prescribed ongoing steroids, H1 blocker and H2 blocker.  She was prescribed Bactrim for treatment of her cellulitis.  She was advised to avoid  penicillins.  She was discharged in good condition. I ordered medication including Solu-Medrol, Benadryl, Pepcid, IV fluids for allergic reaction Reevaluation of the patient after these medicines showed that the patient resolved I have reviewed the patients home medicines and have made adjustments as needed   Social Determinants of Health:  Has access to outpatient care        Final Clinical Impression(s) / ED Diagnoses Final diagnoses:  Allergic reaction, initial encounter    Rx / DC Orders ED Discharge Orders          Ordered    predniSONE (DELTASONE) 10 MG tablet  Daily        11/17/22 0016    diphenhydrAMINE (BENADRYL) 25 MG tablet  Every 6 hours        11/17/22 0016    famotidine (PEPCID) 20 MG tablet  2 times daily        11/17/22 0016    sulfamethoxazole-trimethoprim (BACTRIM DS) 800-160 MG tablet  2 times daily        11/17/22 0016              Godfrey Pick, MD 11/17/22 [phone]

## 2022-11-16 NOTE — ED Triage Notes (Signed)
Pt c/o allergic reaction to amoxicillin. Pt states she took it about 45 mins ago. Pt states her airway is closing and it is hard to breathe.

## 2022-11-17 MED ORDER — SULFAMETHOXAZOLE-TRIMETHOPRIM 800-160 MG PO TABS: 1.0000 | ORAL_TABLET | Freq: Two times a day (BID) | ORAL | 0 refills | Status: AC

## 2022-11-17 MED ORDER — DIPHENHYDRAMINE HCL 25 MG PO TABS: 25.0000 mg | ORAL_TABLET | Freq: Four times a day (QID) | ORAL | 0 refills | Status: DC

## 2022-11-17 MED ORDER — PREDNISONE 10 MG PO TABS: 40.0000 mg | ORAL_TABLET | Freq: Every day | ORAL | 0 refills | Status: AC

## 2022-11-17 MED ORDER — FAMOTIDINE 20 MG PO TABS: 20.0000 mg | ORAL_TABLET | Freq: Two times a day (BID) | ORAL | 0 refills | Status: DC

## 2022-11-17 NOTE — Discharge Instructions (Addendum)
There were medications that were sent to your pharmacy.  His own, Benadryl, and Pepcid are ongoing treatments for your recent allergic reaction.  Discontinue use of amoxicillin.  Avoid use of penicillins in the future.  A separate prescription for a medication called Bactrim was sent to your pharmacy.  This is an alternative antibiotic to treat your skin infection.  Follow-up with your primary care doctor.  Return to the emergency department for any new or worsening symptoms of concern.

## 2022-12-03 DIAGNOSIS — F17200 Nicotine dependence, unspecified, uncomplicated: Secondary | ICD-10-CM | POA: Diagnosis not present

## 2022-12-03 DIAGNOSIS — Z0001 Encounter for general adult medical examination with abnormal findings: Secondary | ICD-10-CM | POA: Diagnosis not present

## 2022-12-03 DIAGNOSIS — E039 Hypothyroidism, unspecified: Secondary | ICD-10-CM | POA: Diagnosis not present

## 2022-12-03 DIAGNOSIS — Z1331 Encounter for screening for depression: Secondary | ICD-10-CM | POA: Diagnosis not present

## 2022-12-03 DIAGNOSIS — E1165 Type 2 diabetes mellitus with hyperglycemia: Secondary | ICD-10-CM | POA: Diagnosis not present

## 2022-12-03 DIAGNOSIS — Z6841 Body Mass Index (BMI) 40.0 and over, adult: Secondary | ICD-10-CM | POA: Diagnosis not present

## 2022-12-03 DIAGNOSIS — K219 Gastro-esophageal reflux disease without esophagitis: Secondary | ICD-10-CM | POA: Diagnosis not present

## 2022-12-03 DIAGNOSIS — I82401 Acute embolism and thrombosis of unspecified deep veins of right lower extremity: Secondary | ICD-10-CM | POA: Diagnosis not present

## 2022-12-11 ENCOUNTER — Ambulatory Visit: Payer: BC Managed Care – PPO | Attending: Internal Medicine | Admitting: Internal Medicine

## 2022-12-11 ENCOUNTER — Encounter: Payer: Self-pay | Admitting: Internal Medicine

## 2022-12-11 VITALS — BP 130/74 | HR 66 | Ht 66.0 in | Wt 256.0 lb

## 2022-12-11 DIAGNOSIS — Z720 Tobacco use: Secondary | ICD-10-CM | POA: Insufficient documentation

## 2022-12-11 DIAGNOSIS — R9431 Abnormal electrocardiogram [ECG] [EKG]: Secondary | ICD-10-CM | POA: Diagnosis not present

## 2022-12-11 NOTE — Progress Notes (Signed)
Cardiology Office Note  Date: 12/11/2022   ID: Jessica [name], DOB May 01, 1966, MRN [id number]  PCP:  Sharilyn Sites, MD  Cardiologist:  Chalmers Guest, MD Electrophysiologist:  None   Reason for Office Visit: Abnormal EKG evaluation the request Dr. Hilma Favors   History of Present Illness: Jessica [name] is a 57 y.o. female known to have DM 2 was referred to cardiology clinic for evaluation of abnormal EKG.  Patient had ER visit on 11/16/2022 for allergic reaction. EKG at that time showed normal sinus rhythm, Q waves in inferior leads and nonspecific intraventricular conduction delay.  Her daughter read the EKG report and wanted cardiology referral for further evaluation.  Patient denies any symptoms of chest pain, DOE, dizziness, lightheadedness, syncope and LE swelling. She exercises every day, walks uphill and downhill. Feels SOB when she walks uphill, normal. Smokes cigarettes, cutting down now.  Past Medical History:  Diagnosis Date   Asthma    Chronic back pain    Diabetes mellitus without complication (HCC)    Changed diet and A1C normal   GERD (gastroesophageal reflux disease)    Sciatica     Past Surgical History:  Procedure Laterality Date   CESAREAN SECTION     VEIN SURGERY      Current Outpatient Medications  Medication Sig Dispense Refill   aspirin EC 81 MG tablet Take 81 mg by mouth every other day. Swallow whole.     No current facility-administered medications for this visit.   Allergies:  Amoxicillin-pot clavulanate and Hydrocodone   Social History: The patient  reports that she has been smoking cigarettes. She started smoking about 43 years ago. She has a 60.00 pack-year smoking history. She has never used smokeless tobacco. She reports current alcohol use. She reports that she does not use drugs.   Family History: The patient's family history includes Cancer in her paternal grandfather; Cervical cancer in her maternal grandmother; Esophageal cancer in  her brother; Heart attack in her father and maternal grandfather; Stomach cancer in her brother.   ROS:  Please see the history of present illness. Otherwise, complete review of systems is positive for none.  All other systems are reviewed and negative.   Physical Exam: VS:  BP 130/74   Pulse 66   Ht 5' 6"$  (1.676 m)   Wt 256 lb (116.1 kg)   LMP 04/09/2020 (Approximate) Comment: spotting  SpO2 97%   BMI 41.32 kg/m , BMI Body mass index is 41.32 kg/m.  Wt Readings from Last 3 Encounters:  12/11/22 256 lb (116.1 kg)  11/16/22 251 lb (113.9 kg)  05/13/22 274 lb 9.6 oz (124.6 kg)    General: Patient appears comfortable at rest. HEENT: Conjunctiva and lids normal, oropharynx clear with moist mucosa. Neck: Supple, no elevated JVP or carotid bruits, no thyromegaly. Lungs: Clear to auscultation, nonlabored breathing at rest. Cardiac: Regular rate and rhythm, no S3 or significant systolic murmur, no pericardial rub. Abdomen: Soft, nontender, no hepatomegaly, bowel sounds present, no guarding or rebound. Extremities: No pitting edema, distal pulses 2+. Skin: Warm and dry. Musculoskeletal: No kyphosis. Neuropsychiatric: Alert and oriented x3, affect grossly appropriate.  ECG:  An ECG dated 11/16/2022 was personally reviewed today and demonstrated:  Normal sinus rhythm, Q waves in inferior leads and nonspecific intraventricular conduction delay  Recent Labwork: 11/16/2022: BUN 21; Creatinine, Ser 0.95; Hemoglobin 15.7; Platelets 268; Potassium 4.3; Sodium 139  No results found for: "CHOL", "TRIG", "HDL", "CHOLHDL", "VLDL", "LDLCALC", "LDLDIRECT"  Other Studies Reviewed  Today:   Assessment and Plan: Patient is a 57 year old F known to have DM 2 was referred to cardiology clinic for evaluation of abnormal EKG.  # Abnormal EKG -EKG from 11/16/2022 showed a normal sinus rhythm, Q waves in inferior leads, nonspecific interventional conduction delay.  Benign. No symptoms of angina or DOE. No  further cardiac testing is indicated at this time.  # Nicotine abuse -Smoking cessation counseling provided, cutting down cigarettes currently. Smoking cessation instruction/counseling given:  counseled patient on the dangers of tobacco use, advised patient to stop smoking, and reviewed strategies to maximize success    I have spent a total of 30 minutes with patient reviewing chart, EKGs, labs and examining patient as well as establishing an assessment and plan that was discussed with the patient.  > 50% of time was spent in direct patient care.      Medication Adjustments/Labs and Tests Ordered: Current medicines are reviewed at length with the patient today.  Concerns regarding medicines are outlined above.   Tests Ordered: No orders of the defined types were placed in this encounter.   Medication Changes: No orders of the defined types were placed in this encounter.   Disposition:  Follow up  PRN  Signed Chey Rachels Fidel Levy, MD, 12/11/2022 10:55 AM    [facility], [place], [place] [address]

## 2022-12-11 NOTE — Patient Instructions (Signed)
Medication Instructions:   Your physician recommends that you continue on your current medications as directed. Please refer to the Current Medication list given to you today.  Labwork:  none  Testing/Procedures:  none  Follow-Up:  Your physician recommends that you schedule a follow-up appointment in: as needed.  Any Other Special Instructions Will Be Listed Below (If Applicable).  If you need a refill on your cardiac medications before your next appointment, please call your pharmacy. 

## 2023-04-05 ENCOUNTER — Emergency Department (HOSPITAL_COMMUNITY)
Admission: EM | Admit: 2023-04-05 | Discharge: 2023-04-06 | Disposition: A | Discharge status: Home / Self Care | Payer: BC Managed Care – PPO | Attending: Emergency Medicine | Admitting: Emergency Medicine

## 2023-04-05 ENCOUNTER — Other Ambulatory Visit: Payer: Self-pay

## 2023-04-05 DIAGNOSIS — T7840XA Allergy, unspecified, initial encounter: Secondary | ICD-10-CM | POA: Insufficient documentation

## 2023-04-05 DIAGNOSIS — L270 Generalized skin eruption due to drugs and medicaments taken internally: Secondary | ICD-10-CM | POA: Insufficient documentation

## 2023-04-05 DIAGNOSIS — T360X5A Adverse effect of penicillins, initial encounter: Secondary | ICD-10-CM | POA: Insufficient documentation

## 2023-04-05 LAB — CBC WITH DIFFERENTIAL/PLATELET
Abs Immature Granulocytes: 0.05 10*3/uL (ref 0.00–0.07)
Basophils Absolute: 0.1 10*3/uL (ref 0.0–0.1)
Basophils Relative: 1 %
Eosinophils Absolute: 0.4 10*3/uL (ref 0.0–0.5)
Eosinophils Relative: 3 %
HCT: 43.9 % (ref 36.0–46.0)
Hemoglobin: 14.3 g/dL (ref 12.0–15.0)
Immature Granulocytes: 0 %
Lymphocytes Relative: 30 %
Lymphs Abs: 3.6 10*3/uL (ref 0.7–4.0)
MCH: 28.9 pg (ref 26.0–34.0)
MCHC: 32.6 g/dL (ref 30.0–36.0)
MCV: 88.7 fL (ref 80.0–100.0)
Monocytes Absolute: 0.8 10*3/uL (ref 0.1–1.0)
Monocytes Relative: 7 %
Neutro Abs: 7.2 10*3/uL (ref 1.7–7.7)
Neutrophils Relative %: 59 %
Platelets: 269 10*3/uL (ref 150–400)
RBC: 4.95 MIL/uL (ref 3.87–5.11)
RDW: 14.7 % (ref 11.5–15.5)
WBC: 12.1 10*3/uL — ABNORMAL HIGH (ref 4.0–10.5)
nRBC: 0 % (ref 0.0–0.2)

## 2023-04-05 LAB — BASIC METABOLIC PANEL
Anion gap: 9 (ref 5–15)
BUN: 24 mg/dL — ABNORMAL HIGH (ref 6–20)
CO2: 23 mmol/L (ref 22–32)
Calcium: 8.6 mg/dL — ABNORMAL LOW (ref 8.9–10.3)
Chloride: 105 mmol/L (ref 98–111)
Creatinine, Ser: 0.77 mg/dL (ref 0.44–1.00)
GFR, Estimated: >60 mL/min (ref >60)
Glucose, Bld: 159 mg/dL — ABNORMAL HIGH (ref 70–99)
Potassium: 3.9 mmol/L (ref 3.5–5.1)
Sodium: 137 mmol/L (ref 135–145)

## 2023-04-05 MED ORDER — EPINEPHRINE 0.3 MG/0.3ML IJ SOAJ
INTRAMUSCULAR | Status: AC
  Administered 2023-04-05: 0.3 mg
  Filled 2023-04-05: qty 0.3

## 2023-04-05 MED ORDER — EPINEPHRINE 0.3 MG/0.3ML IJ SOAJ
0.3000 mg | Freq: Once | INTRAMUSCULAR | Status: AC
  Administered 2023-04-05: 0.3 mg via INTRAMUSCULAR

## 2023-04-05 MED ORDER — DIPHENHYDRAMINE HCL 50 MG/ML IJ SOLN
25.0000 mg | Freq: Once | INTRAMUSCULAR | Status: AC
  Administered 2023-04-05: 25 mg via INTRAVENOUS
  Filled 2023-04-05: qty 1

## 2023-04-05 MED ORDER — FAMOTIDINE IN NACL 20-0.9 MG/50ML-% IV SOLN
20.0000 mg | Freq: Once | INTRAVENOUS | Status: AC
  Administered 2023-04-05: 20 mg via INTRAVENOUS
  Filled 2023-04-05: qty 50

## 2023-04-05 MED ORDER — METHYLPREDNISOLONE SODIUM SUCC 125 MG IJ SOLR
125.0000 mg | Freq: Once | INTRAMUSCULAR | Status: AC
  Administered 2023-04-05: 125 mg via INTRAVENOUS
  Filled 2023-04-05: qty 2

## 2023-04-05 NOTE — ED Provider Notes (Signed)
[place] EMERGENCY DEPARTMENT AT [facility] Provider Note   CSN: [id number] Arrival date & time: 04/05/23  2248     History  Chief Complaint  Patient presents with   Allergic Reaction    Jessica [name] is a 57 y.o. female.  Patient is a 57 year old female presenting with complaints of an allergic reaction.  Patient was giving her granddaughter amoxicillin when she licked the syringe to keep a drop of the antibiotic from spilling.  She then developed rash, itching, and swelling all over.  She presents for evaluation of this.  She denies any throat swelling or intraoral involvement.  The history is provided by the patient.       Home Medications Prior to Admission medications   Medication Sig Start Date End Date Taking? Authorizing Provider  aspirin EC 81 MG tablet Take 81 mg by mouth every other day. Swallow whole.    [provider]      Allergies    Amoxicillin-pot clavulanate and Hydrocodone    Review of Systems   Review of Systems  All other systems reviewed and are negative.   Physical Exam Updated Vital Signs BP (!) 170/72 (BP Location: Right Arm)   Pulse (!) 102   Temp 97.8 F (36.6 C) (Oral)   Resp 18   LMP 04/09/2020 (Approximate) Comment: spotting  SpO2 (!) 88%  Physical Exam Vitals and nursing note reviewed.  Constitutional:      General: She is not in acute distress.    Appearance: She is well-developed. She is not diaphoretic.  HENT:     Head: Normocephalic and atraumatic.     Mouth/Throat:     Mouth: Mucous membranes are moist.     Pharynx: No oropharyngeal exudate or posterior oropharyngeal erythema.  Cardiovascular:     Rate and Rhythm: Normal rate and regular rhythm.     Heart sounds: No murmur heard.    No friction rub. No gallop.  Pulmonary:     Effort: Pulmonary effort is normal. No respiratory distress.     Breath sounds: Normal breath sounds. No wheezing.  Abdominal:     General: Bowel sounds are normal. There  is no distension.     Palpations: Abdomen is soft.     Tenderness: There is no abdominal tenderness.  Musculoskeletal:        General: Normal range of motion.     Cervical back: Normal range of motion and neck supple.  Skin:    General: Skin is warm and dry.     Comments: There is a diffuse urticarial rash noted.  Neurological:     General: No focal deficit present.     Mental Status: She is alert and oriented to person, place, and time.     ED Results / Procedures / Treatments   Labs (all labs ordered are listed, but only abnormal results are displayed) Labs Reviewed - No data to display  EKG None  Radiology No results found.  Procedures Procedures  {Document cardiac monitor, telemetry assessment procedure when appropriate:1}  Medications Ordered in ED Medications  methylPREDNISolone sodium succinate (SOLU-MEDROL) 125 mg/2 mL injection 125 mg (has no administration in time range)  diphenhydrAMINE (BENADRYL) injection 25 mg (has no administration in time range)  famotidine (PEPCID) IVPB 20 mg premix (has no administration in time range)  EPINEPHrine (EPI-PEN) 0.3 mg/0.3 mL injection (has no administration in time range)  EPINEPHrine (EPI-PEN) injection 0.3 mg (0.3 mg Intramuscular Given 04/05/23 2305)    ED Course/  Medical Decision Making/ A&P   {   Click here for ABCD2, HEART and other calculatorsREFRESH Note before signing :1}                          Medical Decision Making Amount and/or Complexity of Data Reviewed Labs: ordered.  Risk Prescription drug management.   ***  {Document critical care time when appropriate:1} {Document review of labs and clinical decision tools ie heart score, Chads2Vasc2 etc:1}  {Document your independent review of radiology images, and any outside records:1} {Document your discussion with family members, caretakers, and with consultants:1} {Document social determinants of health affecting pt's care:1} {Document your decision  making why or why not admission, treatments were needed:1} Final Clinical Impression(s) / ED Diagnoses Final diagnoses:  None    Rx / DC Orders ED Discharge Orders     None

## 2023-04-05 NOTE — ED Triage Notes (Signed)
Pt presents with allergic reaction to amoxicillin that she was giving a child.  Known allergy to same. She "licked" the syringe.  She is "shaky"   States she used an albuterol neb at home and took one 25 mg benadryl.

## 2023-04-06 MED ORDER — EPINEPHRINE 0.3 MG/0.3ML IJ SOAJ: 0.3000 mg | INTRAMUSCULAR | 0 refills | Status: AC | PRN

## 2023-04-06 MED ORDER — PREDNISONE 10 MG PO TABS: 20.0000 mg | ORAL_TABLET | Freq: Two times a day (BID) | ORAL | 0 refills | Status: AC

## 2023-04-06 NOTE — ED Notes (Signed)
Pt asleep, O2 at 89%. Placed pt on 2L [place]; O2 at 94%

## 2023-04-06 NOTE — ED Notes (Signed)
ED Provider at bedside. 

## 2023-04-06 NOTE — Discharge Instructions (Signed)
Begin taking prednisone as prescribed.  Take Benadryl 25 mg every 6 hours for the next 3 days.  Return to the emergency department if you develop difficulty breathing, throat swelling, or for other new and concerning symptoms.

## 2024-12-06 ENCOUNTER — Ambulatory Visit: Admitting: Adult Health
